# Patient Record
Sex: Male | Born: 1980 | Race: White | Hispanic: No | Marital: Single | Smoking: Former smoker
Health system: Southern US, Academic
[De-identification: ages and names within clinical notes are randomized; demographics above are authoritative.]

## PROBLEM LIST (undated history)

## (undated) DIAGNOSIS — C801 Malignant (primary) neoplasm, unspecified: Secondary | ICD-10-CM

## (undated) DIAGNOSIS — I10 Essential (primary) hypertension: Secondary | ICD-10-CM

## (undated) DIAGNOSIS — R12 Heartburn: Secondary | ICD-10-CM

## (undated) DIAGNOSIS — C4359 Malignant melanoma of other part of trunk: Secondary | ICD-10-CM

## (undated) DIAGNOSIS — Z973 Presence of spectacles and contact lenses: Secondary | ICD-10-CM

## (undated) DIAGNOSIS — Z87898 Personal history of other specified conditions: Secondary | ICD-10-CM

## (undated) DIAGNOSIS — G1221 Amyotrophic lateral sclerosis: Secondary | ICD-10-CM

## (undated) DIAGNOSIS — E785 Hyperlipidemia, unspecified: Secondary | ICD-10-CM

## (undated) HISTORY — DX: Malignant melanoma of other part of trunk (CMS HCC): C43.59

## (undated) HISTORY — DX: Essential (primary) hypertension: I10

## (undated) HISTORY — PX: ORBITAL RECONSTRUCTION: SHX2115

## (undated) HISTORY — PX: EXCISION BENIGN SKIN LESION TRUNK / ARM / LEG: SUR490

## (undated) HISTORY — PX: HX LAP CHOLECYSTECTOMY: SHX56

## (undated) HISTORY — PX: HX TONSILLECTOMY: SHX27

## (undated) HISTORY — PX: LUMBAR PUNCTURE: SHX1985

## (undated) HISTORY — PX: TONSILLECTOMY: SUR1361

## (undated) HISTORY — PX: CHOLECYSTECTOMY: SHX55

---
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
## 2013-11-06 ENCOUNTER — Ambulatory Visit: Payer: BC Managed Care – PPO | Attending: Dermatology | Admitting: Dermatology

## 2013-11-06 VITALS — BP 120/85 | Ht 68.98 in | Wt 273.6 lb

## 2013-11-06 DIAGNOSIS — Q828 Other specified congenital malformations of skin: Secondary | ICD-10-CM

## 2013-11-06 DIAGNOSIS — L814 Other melanin hyperpigmentation: Secondary | ICD-10-CM

## 2013-11-06 DIAGNOSIS — L819 Disorder of pigmentation, unspecified: Secondary | ICD-10-CM

## 2013-11-06 NOTE — Progress Notes (Signed)
Subjective:       Patient ID: Victor Frazier is a 33 y.o. male     Chief Complaint:     Chief Complaint   Patient presents with    Skin Check     Multiple Nevi, Skin tags bilateral axilla and neck.        HPI 33 yo male presenting for evaluation of a lesion on the nose and for lesions in the armpit.  Admits to developing a brown lesion on the nose.  States the lesion is sometimes scaly after her gets a lot of sun.  No rapid growth.  No pain or bleeding associated.  Denies a personal history of skin cancer.  Unknown family history due to adoption.  He also notes multiple tender lesions in the bilateral armpits.  No bleeding associated.  Lesions occasionally bleed.  He does not use sunscreen.  No other skin related complaints.      Review of Systems   Constitutional: Negative for fever and chills.   Skin: Negative for itching and rash.     Current Outpatient Prescriptions   Medication Sig    amoxicillin (AMOXIL) 500 mg Oral Capsule Take 500 mg by mouth Three times a day    metoprolol (LOPRESSOR) 25 mg Oral Tablet Take 25 mg by mouth Once a day       Objective:   .   Filed Vitals:    11/06/13 0955   BP: 120/85   Height: 1.752 m (5' 8.98")   Weight: 124.1 kg (273 lb 9.5 oz)       Physical Exam   Constitutional: He appears well-developed and well-nourished. No distress.   HENT:   Head:       Neurological: He is alert.   Skin: No rash noted. He is not diaphoretic. No erythema. No pallor.          General skin exam was performed including head, neck, anterio/posterior trunk, bilateral upper and lower extremitites and revealed no areas of concern other than those documented.     Assessment & Plan:       ICD-9-CM    1. Solar lentigo - cannot rule-out early pigmented actinic keratoses, although no evidence of scale today    -follow  -start sunscreen daily, at least on the face  -wear protective hat  -return to clinic for possible cryotherapy if lesion grows, changes, becomes scaly 709.09    2. Accessory skin tags (see  number 1 on body diagram) - irritated  -removed with tissue scissors x 8; bleeding controlled with aluminum chloride 757.39 EXCISION SKIN TAGS (AMB ONLY)     Ricki Miller, MD

## 2013-11-06 NOTE — Procedures (Signed)
See progress note.

## 2014-01-09 ENCOUNTER — Ambulatory Visit (INDEPENDENT_AMBULATORY_CARE_PROVIDER_SITE_OTHER): Payer: BC Managed Care – PPO | Admitting: Otolaryngology

## 2014-01-16 ENCOUNTER — Encounter (INDEPENDENT_AMBULATORY_CARE_PROVIDER_SITE_OTHER): Payer: Self-pay | Admitting: Otolaryngology

## 2014-01-16 ENCOUNTER — Other Ambulatory Visit (INDEPENDENT_AMBULATORY_CARE_PROVIDER_SITE_OTHER): Payer: BC Managed Care – PPO

## 2014-01-16 ENCOUNTER — Ambulatory Visit (INDEPENDENT_AMBULATORY_CARE_PROVIDER_SITE_OTHER): Payer: BC Managed Care – PPO | Admitting: Otolaryngology

## 2014-01-16 VITALS — BP 124/92 | HR 88 | Temp 98.0°F | Ht 69.0 in | Wt 272.9 lb

## 2014-01-16 DIAGNOSIS — J3489 Other specified disorders of nose and nasal sinuses: Secondary | ICD-10-CM

## 2014-01-16 DIAGNOSIS — R51 Headache: Secondary | ICD-10-CM

## 2014-01-16 DIAGNOSIS — J309 Allergic rhinitis, unspecified: Secondary | ICD-10-CM

## 2014-01-16 DIAGNOSIS — R519 Headache, unspecified: Secondary | ICD-10-CM

## 2014-01-16 DIAGNOSIS — J320 Chronic maxillary sinusitis: Secondary | ICD-10-CM

## 2014-01-16 MED ORDER — TRIAMCINOLONE ACETONIDE 55 MCG NASAL SPRAY AEROSOL
2.0000 | INHALATION_SPRAY | Freq: Every day | NASAL | Status: DC
Start: 2014-01-16 — End: 2014-01-17
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
## 2014-01-17 ENCOUNTER — Other Ambulatory Visit (INDEPENDENT_AMBULATORY_CARE_PROVIDER_SITE_OTHER): Payer: Self-pay | Admitting: Otolaryngology

## 2014-01-17 ENCOUNTER — Encounter (INDEPENDENT_AMBULATORY_CARE_PROVIDER_SITE_OTHER): Payer: Self-pay | Admitting: Otolaryngology

## 2014-01-17 DIAGNOSIS — J3489 Other specified disorders of nose and nasal sinuses: Secondary | ICD-10-CM

## 2014-01-17 DIAGNOSIS — J320 Chronic maxillary sinusitis: Secondary | ICD-10-CM

## 2014-01-17 DIAGNOSIS — R51 Headache: Secondary | ICD-10-CM

## 2014-01-17 DIAGNOSIS — J309 Allergic rhinitis, unspecified: Secondary | ICD-10-CM

## 2014-01-17 DIAGNOSIS — R519 Headache, unspecified: Secondary | ICD-10-CM

## 2014-01-17 MED ORDER — TRIAMCINOLONE ACETONIDE 55 MCG NASAL SPRAY AEROSOL
2.0000 | INHALATION_SPRAY | Freq: Every day | NASAL | Status: AC
Start: 2014-01-17 — End: 2014-02-16

## 2014-01-17 NOTE — Telephone Encounter (Signed)
-----   Message from Berlin Hun sent at 01/16/2014  4:48 PM EDT -----  >> Tula Nakayama PYERITZ 01/16/2014 04:48 PM  [place]  Pt states that the nasacort was supposed to be sent to the    [id number] [place], [place]    [address] [place] [address]    Phone: [phone] Fax: [phone]    Open 24 Hours?: Yes      Thanks

## 2014-01-17 NOTE — Telephone Encounter (Signed)
Medication was sent to the wrong pharmacy--the correct pharmacy is [facility] [address]

## 2014-04-22 ENCOUNTER — Other Ambulatory Visit (HOSPITAL_COMMUNITY): Payer: Self-pay | Admitting: Chiropractor

## 2014-04-22 ENCOUNTER — Ambulatory Visit
Admission: RE | Admit: 2014-04-22 | Discharge: 2014-04-22 | Disposition: A | Discharge status: Home / Self Care | Payer: BC Managed Care – PPO | Source: Ambulatory Visit | Attending: Chiropractor | Admitting: Chiropractor

## 2014-04-22 DIAGNOSIS — M545 Low back pain, unspecified: Secondary | ICD-10-CM

## 2014-04-22 DIAGNOSIS — M21161 Varus deformity, not elsewhere classified, right knee: Secondary | ICD-10-CM

## 2014-04-22 DIAGNOSIS — M2170 Unequal limb length (acquired), unspecified site: Secondary | ICD-10-CM | POA: Insufficient documentation

## 2014-04-22 DIAGNOSIS — M21162 Varus deformity, not elsewhere classified, left knee: Secondary | ICD-10-CM

## 2014-05-24 ENCOUNTER — Ambulatory Visit (INDEPENDENT_AMBULATORY_CARE_PROVIDER_SITE_OTHER): Payer: BC Managed Care – PPO

## 2014-05-24 DIAGNOSIS — Z3141 Encounter for fertility testing: Principal | ICD-10-CM

## 2014-05-24 NOTE — Progress Notes (Signed)
.  [facility]  Andrology Lab  [address]  [place], [place] [address]    Horald Chestnut, PhD, [facility]           Oralia Rud, PhD, HCLD  Henreitta Cea, BS, [place]   Phone: [phone]  FAX: [phone]   Semen Analysis    Pt Name Victor Frazier  Procedure: Semen Analysis     Wife's Name n/a   Date: 05/24/14   Physician [place] Crane  Days of Abstinence: 3   Time of Ejaculate 1147  Method (1) Cup   Time Sample Received 1147  Method (2) Masturbation   Time of Analysis 1209  Technologist DZ   Patient Lab number [id number]  Any Sample lost No     Characteristics of Semen Sample  Characteristic  Normal  Sperm Count  Normal   Volume in ml 2.0 1.5 to 55ml  Liquefaction yes Yes   Color Bonney Leitz to Marathon Oil  Gel no No   PH Value 8.2 7.5 to 8.5  Total Sperm (Mil/ml) 78.7 >15  million/ml   Round Cells 0 < 5/hpf  Active Sperm (Mil/ml) 44.3 > 10 million/ml   Viscosity 1 < 2  % Motile 56.4 >40%   Debris Low Low  Grade 3.0 3 to 4   Agglutination no No   Technologist DZ   Crystals no No         Sperm Morphology  (Based upon the [facility] 2011 Standard)    Normal    Normal   WHO Normal Forms 8.5% > 11%  Twin Tail 1% < 4%   Abnormal Head 76% < 70%  Cytoplasmic Droplet 0 < 4%   Tapered Head 1% < 4%  Other 6.5% < 4%   Coiled Tail 7% < 4%  Technologist DZ      Strict Sperm Morphology (Based upon the [facility] Strict Morphology Standard)  Normal Forms  4% Normal > 10%  Marginal 5-9%  Abnormal Forms 96% Normal < 90%  Marginal 91-95% Technologist  DZ     Comments  Liquifaction  WBC Stain    Morphology  Other      Andrologist's Comment:   Report Date: 05/27/2014

## 2014-06-10 ENCOUNTER — Other Ambulatory Visit (INDEPENDENT_AMBULATORY_CARE_PROVIDER_SITE_OTHER): Payer: BC Managed Care – PPO

## 2014-06-10 DIAGNOSIS — R51 Headache: Secondary | ICD-10-CM

## 2014-06-10 DIAGNOSIS — J3489 Other specified disorders of nose and nasal sinuses: Secondary | ICD-10-CM

## 2014-06-10 DIAGNOSIS — R519 Headache, unspecified: Secondary | ICD-10-CM

## 2014-06-10 DIAGNOSIS — J320 Chronic maxillary sinusitis: Principal | ICD-10-CM

## 2014-06-10 DIAGNOSIS — J309 Allergic rhinitis, unspecified: Secondary | ICD-10-CM

## 2014-06-11 ENCOUNTER — Ambulatory Visit (INDEPENDENT_AMBULATORY_CARE_PROVIDER_SITE_OTHER): Payer: BC Managed Care – PPO | Admitting: Otolaryngology

## 2014-06-11 ENCOUNTER — Encounter (INDEPENDENT_AMBULATORY_CARE_PROVIDER_SITE_OTHER): Payer: Self-pay | Admitting: Otolaryngology

## 2014-06-11 VITALS — BP 128/76 | HR 91 | Temp 97.6°F | Ht 69.02 in | Wt 279.4 lb

## 2014-06-11 DIAGNOSIS — J320 Chronic maxillary sinusitis: Secondary | ICD-10-CM

## 2014-06-11 DIAGNOSIS — J3489 Other specified disorders of nose and nasal sinuses: Principal | ICD-10-CM

## 2014-06-11 DIAGNOSIS — R51 Headache: Secondary | ICD-10-CM

## 2014-06-11 DIAGNOSIS — R519 Headache, unspecified: Secondary | ICD-10-CM
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
## 2014-07-22 ENCOUNTER — Encounter (INDEPENDENT_AMBULATORY_CARE_PROVIDER_SITE_OTHER): Payer: BC Managed Care – PPO | Admitting: Otolaryngology

## 2015-01-30 ENCOUNTER — Ambulatory Visit (INDEPENDENT_AMBULATORY_CARE_PROVIDER_SITE_OTHER): Payer: BC Managed Care – PPO | Admitting: Otolaryngology

## 2015-01-30 VITALS — BP 114/88 | HR 88 | Temp 97.5°F | Ht 69.0 in | Wt 281.4 lb

## 2015-01-30 DIAGNOSIS — J329 Chronic sinusitis, unspecified: Secondary | ICD-10-CM

## 2015-01-30 DIAGNOSIS — J320 Chronic maxillary sinusitis: Secondary | ICD-10-CM

## 2015-01-30 MED ORDER — AMOXICILLIN 875 MG-POTASSIUM CLAVULANATE 125 MG TABLET
1.0000 | ORAL_TABLET | Freq: Two times a day (BID) | ORAL | Status: AC
Start: 2015-01-30 — End: 2015-02-09

## 2015-01-30 NOTE — Progress Notes (Signed)
[facility], [place]  [place] [address]  [phone]    PATIENT NAME:  Victor Frazier  MRN:  [id number]  DOB:  21-Jan-1981  DATE OF SERVICE: 01/30/2015    HPI:  Ori Trejos is a 34 y.o. year old male who comes in with headache and evidence of sinusitis.  He was last seen in December 2015 for follow up.  He had a history of recurrent sinusitis and an injury to the left side of his face.  He had partial opacification of the left maxillary sinus, which I felt was related primarily to his injury and subsequent facial corrective surgery.  Repeat films done revealed some degree of haziness in the maxillary antrum inferiorly but there was improvement.  Recently he started getting in trouble with sinusitis again.  He was traveling in his works 3 weeks ago and developed bilateral maxillary discomfort.  He was put on Zithromax, which did not help.  Two weeks ago he was put on Doxycycline, which he did not tolerate well.  He stopped that about 10 days ago when he was at the ER in [place], where he was given Toradol, Ultracet for pain and Augmentin 875 mg x14 days.  He has about a week of the Augmentin left.  He is feeling a lot better but not completely at this point.  He is on no other medications.  In the last several years he has started having more allergy symptoms, which might be the root of his recurrent sinusitis problem.     Physical Exam:  Blood pressure 114/88, pulse 88, temperature 36.4 C (97.5 F), temperature source Thermal Scan, height 1.753 m (5\' 9" ), weight 127.642 kg (281 lb 6.4 oz), SpO2 98 %.  Body mass index is 41.54 kg/(m^2).  On exam he is a well nourished, well developed, healthy appearing 34 year old in no acute distress.  Head is normocephalic.  Facies are symmetric.  External ears, canals and tympanic membranes are normal.  Nose reveals mild erythema.  Mouth and oropharynx are normal.  Nasopharynx reveals some purulent drainage on the  left.      Procedure:  No notes on file    Data Reviewed:  I reviewed his CT scan, which showed some chronic changes in the floor of the left maxillary sinus, which I think is mostly from his previous problem.     Assessment:    (1) Recurrent sinusitis, currently being treated with Augmentin.    Plan:    (1) I will have him finish his current Augmentin course and add another 10 days to make sure we have knocked it out.  I will have him get on Nasacort AQ and use a Neti Pot, which he recently got but has not used much.  I want him to consider getting allergy testing performed, which he is interested in doing.     Corky Sing., MD    SDR  07.29.16

## 2015-02-17 ENCOUNTER — Ambulatory Visit (HOSPITAL_BASED_OUTPATIENT_CLINIC_OR_DEPARTMENT_OTHER): Payer: BC Managed Care – PPO

## 2015-02-17 ENCOUNTER — Ambulatory Visit
Payer: BC Managed Care – PPO | Attending: Dermatology | Admitting: Student in an Organized Health Care Education/Training Program

## 2015-02-17 ENCOUNTER — Other Ambulatory Visit (HOSPITAL_BASED_OUTPATIENT_CLINIC_OR_DEPARTMENT_OTHER): Payer: Self-pay | Admitting: Dermatology

## 2015-02-17 VITALS — BP 124/88 | Ht 68.54 in | Wt 276.5 lb

## 2015-02-17 DIAGNOSIS — L918 Other hypertrophic disorders of the skin: Secondary | ICD-10-CM | POA: Insufficient documentation

## 2015-02-17 DIAGNOSIS — L989 Disorder of the skin and subcutaneous tissue, unspecified: Secondary | ICD-10-CM

## 2015-02-17 DIAGNOSIS — M791 Myalgia, unspecified site: Secondary | ICD-10-CM

## 2015-02-17 DIAGNOSIS — L720 Epidermal cyst: Secondary | ICD-10-CM | POA: Insufficient documentation

## 2015-02-17 LAB — CREATINE KINASE (CK), TOTAL, SERUM OR PLASMA: CREATINE KINASE: 696 U/L — ABNORMAL HIGH (ref 45–225)

## 2015-02-17 NOTE — Progress Notes (Addendum)
Skin biopsy x 1. John Kozikowski, LPN

## 2015-02-17 NOTE — Progress Notes (Signed)
02/17/15 1100   Medication Administration   Medication  Lidocaine with Epi   Medication Dose 1cc   Route of Administration Intradermal   NDC # C107165   LOT # [id number]   Expiration date 10/04/14   Manufacturer APP   Clinic Supplied Yes   Patient Supplied No   Comments: Skin biopsy x 1.

## 2015-02-17 NOTE — Procedures (Addendum)
Please see progress note.     Victor Newbold, MD

## 2015-02-17 NOTE — Progress Notes (Addendum)
Subjective:       Patient ID: Victor Frazier is a 34 y.o. male     Chief Complaint:     Chief Complaint   Patient presents with    Skin Check     Skin/mole check. Last seen 11-17-13. Hx of solar lentigo, skin tags.        HPI   34 year old white male with no history of skin cancer and unknown family history of skin cancer presents for skin check. He complains of irritated skin tags and a cyst on the right flank. He notes that recently he was seen by Victor Frazier rheumatology for muscle pain of his lower calf. Victor Frazier at that time did muscle enzymes which were elevated per patient. He has no weakness or muscle pain today. He recently lost 20 pounds in thee weeks from being sick with a terrible sinus infection.     Review of Systems   Constitutional: Negative for fever.   Skin: Negative for rash.     Current Outpatient Prescriptions   Medication Sig    amoxicillin (AMOXIL) 500 mg Oral Capsule Take 500 mg by mouth Three times a day    DM HB/PSEUDOEPHED/ACETAMIN/CP (THERAFLU COLD-COUGH ORAL) Take by mouth    GUAIFENESIN/DEXTROMETHORPHAN (MUCINEX DM ORAL) Take by mouth    metoprolol (LOPRESSOR) 25 mg Oral Tablet Take 25 mg by mouth Once a day       Objective:   .   Filed Vitals:    02/17/15 0853   BP: 124/88   Height: 1.741 m (5' 8.54")   Weight: 125.4 kg (276 lb 7.3 oz)       Physical Exam   Constitutional: No distress.   Musculoskeletal:        Left lower leg: He exhibits no tenderness, no swelling and no edema.   Skin:          General skin exam was performed including head, neck, anterior and posterior trunk, bilateral upper, and lower extremities and revealed no areas of concern other than those documented.        Assessment & Plan:     Inflamed acrochordon body (body #3)  - Procedure: Area cleaned with alcohol and skin tags removed x2 (right neck and right axilla) with tissue scissors. Bleeding controlled with Aluminum Chloride.  Vaseline and Band-Aid applied. Patient tolerated well.    Skin lesion: IDN  vs cyst (body #4)  - TIME OUT: A time out was performed to confirm the correct patient, procedure, and site. Consent obtained, area cleaned, and anesthetized. A shave biopsy was performed of right flank. Aluminum chloride was used for hemostasis. Vaseline and bandage were placed over the wound and wound care instructions were given. Patient demonstrated understanding of the instructions. Patient was advised that it would take approximately 2-3 weeks for the pathology results to be available and that I will personally contact the patient at the number provided to further discuss the pathology results.    Hx of muscle tenderness and elevated CPK  Recheck CPK and aldolase  Follow up in 6 months    Victor Stabile, MD  02/17/2015, 11:23        I saw and examined the patient.  I was present and supervised the entire procedure.  I reviewed the resident's note.  I agree with the findings and plan of care as documented in the resident's note.  Any exceptions/additions are edited/noted.    Dairl Ponder, MD 02/23/2015, 21:40

## 2015-02-18 LAB — ALDOLASE, SERUM: ALDOLASE, SERUM: 8.2 U/L — ABNORMAL HIGH (ref <7.7)

## 2015-02-19 LAB — HISTORICAL SURGICAL PATHOLOGY SPECIMEN

## 2015-06-03 ENCOUNTER — Ambulatory Visit
Payer: BC Managed Care – PPO | Attending: Dermatology | Admitting: Student in an Organized Health Care Education/Training Program

## 2015-06-03 VITALS — BP 154/90 | HR 87 | Ht 68.35 in | Wt 286.4 lb

## 2015-06-03 DIAGNOSIS — M3320 Polymyositis, organ involvement unspecified: Secondary | ICD-10-CM | POA: Insufficient documentation

## 2015-06-03 DIAGNOSIS — M721 Knuckle pads: Secondary | ICD-10-CM | POA: Insufficient documentation

## 2015-06-03 NOTE — Progress Notes (Addendum)
Subjective:       Patient ID: Victor Frazier is a 34 y.o. male     Chief Complaint:     Chief Complaint   Patient presents with    Skin Check        HPI   34 y.o. white male with no history of skin cancer and unknown family history of skin cancer presents for follow up for muscle weakness. 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 It is no longer painful, but he does have some weakness. His Rheumatologist did labs last week. Not treating as is often self limiting. CPK and Aldolase have been elevated in the past. He bites his nails. No rash     Review of Systems   Constitutional: Negative for fever and chills.   Skin: Negative for rash and wound.     Current Outpatient Prescriptions   Medication Sig    amoxicillin (AMOXIL) 500 mg Oral Capsule Take 500 mg by mouth Three times a day    DM HB/PSEUDOEPHED/ACETAMIN/CP (THERAFLU COLD-COUGH ORAL) Take by mouth    GUAIFENESIN/DEXTROMETHORPHAN (MUCINEX DM ORAL) Take by mouth    metoprolol (LOPRESSOR) 25 mg Oral Tablet Take 25 mg by mouth Once a day       Objective:   .   Filed Vitals:    06/03/15 0938   BP: 154/90   Pulse: 87   Height: 1.736 m (5' 8.35")   Weight: 129.9 kg (286 lb 6 oz)       Physical Exam   Constitutional: He appears well-developed and well-nourished. No distress.   Musculoskeletal:        Left lower leg: He exhibits no tenderness, no swelling and no edema.   Skin: Skin is warm and dry.          General skin exam was performed including head, neck, anterior and posterior trunk, bilateral upper, and lower extremities and revealed no areas of concern other than those documented.        Assessment & Plan:     Favor Knuckle pads (#2 body)  - Reassurance    Changes secondary to nail biting (#1 body)  - Reassurance.    Localized polymyositis   - Following with rheumatology.  - No rash today or ever.  - Will call if develops any skin symptoms.    The patient was educated on the importance of avoiding excessive sun exposure and wearing  sunscreen daily.  Advised patient to re-apply sunscreen every 2-3 hours.  Advised the patient to avoid going to the tanning beds.  Advised to check skin routinely for any changes, especially any new moles or changes in existing moles.    RTC PRN    Dolores Patty, MD 06/03/2015 10:05  [facility]  [facility]        I saw and examined the patient.  I reviewed the resident's note.  I agree with the findings and plan of care as documented in the resident's note.  Any exceptions/additions are edited/noted.    Dairl Ponder, MD

## 2015-08-20 ENCOUNTER — Encounter (HOSPITAL_BASED_OUTPATIENT_CLINIC_OR_DEPARTMENT_OTHER): Payer: BC Managed Care – PPO | Admitting: Student in an Organized Health Care Education/Training Program

## 2016-03-19 ENCOUNTER — Encounter (HOSPITAL_BASED_OUTPATIENT_CLINIC_OR_DEPARTMENT_OTHER): Payer: Self-pay

## 2016-03-19 NOTE — Progress Notes (Signed)
New patient packet received.    Victor Parody Ashwin Tibbs, MA  03/19/2016, 15:30

## 2016-05-31 ENCOUNTER — Ambulatory Visit (HOSPITAL_BASED_OUTPATIENT_CLINIC_OR_DEPARTMENT_OTHER): Payer: BC Managed Care – PPO | Admitting: Rheumatology

## 2016-06-01 ENCOUNTER — Ambulatory Visit (HOSPITAL_BASED_OUTPATIENT_CLINIC_OR_DEPARTMENT_OTHER): Payer: BC Managed Care – PPO

## 2016-06-01 ENCOUNTER — Ambulatory Visit: Payer: BC Managed Care – PPO | Attending: Rheumatology | Admitting: Rheumatology

## 2016-06-01 VITALS — BP 136/80 | HR 83 | Temp 97.3°F | Ht 68.35 in | Wt 269.4 lb

## 2016-06-01 DIAGNOSIS — M3320 Polymyositis, organ involvement unspecified: Secondary | ICD-10-CM | POA: Insufficient documentation

## 2016-06-01 DIAGNOSIS — R531 Weakness: Secondary | ICD-10-CM | POA: Insufficient documentation

## 2016-06-01 DIAGNOSIS — Z6841 Body Mass Index (BMI) 40.0 and over, adult: Secondary | ICD-10-CM

## 2016-06-01 DIAGNOSIS — M545 Low back pain: Secondary | ICD-10-CM | POA: Insufficient documentation

## 2016-06-01 DIAGNOSIS — R29898 Other symptoms and signs involving the musculoskeletal system: Secondary | ICD-10-CM

## 2016-06-01 DIAGNOSIS — Z720 Tobacco use: Secondary | ICD-10-CM | POA: Insufficient documentation

## 2016-06-01 LAB — COMPREHENSIVE METABOLIC PANEL, NON-FASTING
ALBUMIN: 3.7 g/dL (ref 3.5–5.0)
ALBUMIN: 3.7 g/dL (ref 3.5–5.0)
ALKALINE PHOSPHATASE: 95 U/L (ref <150)
ALT (SGPT): 50 U/L (ref <55)
ANION GAP: 9 mmol/L (ref 4–13)
AST (SGOT): 39 U/L (ref 8–48)
BILIRUBIN TOTAL: 0.5 mg/dL (ref 0.3–1.3)
BUN/CREA RATIO: 19 (ref 6–22)
BUN: 14 mg/dL (ref 8–25)
CALCIUM: 9.3 mg/dL (ref 8.5–10.2)
CHLORIDE: 106 mmol/L (ref 96–111)
CO2 TOTAL: 25 mmol/L (ref 22–32)
CREATININE: 0.72 mg/dL (ref 0.62–1.27)
ESTIMATED GFR: >59 mL/min/1.73mˆ2 (ref >59)
GLUCOSE: 89 mg/dL (ref 65–139)
GLUCOSE: 89 mg/dL (ref 65–139)
POTASSIUM: 4.2 mmol/L (ref 3.5–5.1)
PROTEIN TOTAL: 7.6 g/dL (ref 6.4–8.3)
SODIUM: 140 mmol/L (ref 136–145)

## 2016-06-01 LAB — CBC WITH DIFF
BASOPHIL #: 0.05 10*3/uL (ref 0.00–0.20)
BASOPHIL %: 1 %
EOSINOPHIL #: 0.15 10*3/uL (ref 0.00–0.50)
EOSINOPHIL %: 3 %
HCT: 43.8 % (ref 36.7–47.0)
HGB: 15.4 g/dL (ref 12.5–16.3)
LYMPHOCYTE #: 1.25 10*3/uL (ref 1.00–4.80)
LYMPHOCYTE %: 24 %
MCH: 31.2 pg (ref 27.4–33.0)
MCHC: 35.1 g/dL (ref 32.5–35.8)
MCHC: 35.1 g/dL (ref 32.5–35.8)
MCV: 88.9 fL (ref 78.0–100.0)
MONOCYTE #: 0.64 10*3/uL (ref 0.30–1.00)
MONOCYTE %: 12 %
MONOCYTE %: 12 %
MPV: 9.1 fL (ref 7.5–11.5)
MPV: 9.1 fL (ref 7.5–11.5)
NEUTROPHIL #: 3.12 10*3/uL (ref 1.50–7.70)
NEUTROPHIL %: 60 %
PLATELETS: 217 10*3/uL (ref 140–450)
RBC: 4.93 10*6/uL (ref 4.06–5.63)
RBC: 4.93 10*6/uL (ref 4.06–5.63)
RDW: 13.4 % (ref 12.0–15.0)
WBC: 5.2 10*3/uL (ref 3.5–11.0)

## 2016-06-01 LAB — CREATINE KINASE (CK), TOTAL, SERUM OR PLASMA: CREATINE KINASE: 775 U/L — ABNORMAL HIGH (ref 45–225)

## 2016-06-01 NOTE — H&P (Signed)
Requesting Physician: Maud Deed, CFNP  History of Present Illness  Victor Frazier is a 35 y.o. male who presents with a chief complaint of No chief complaint on file.   to clinic. Was a patient of Dr. 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 Last saw him May 2017. About 3 years ago. Pain in left calf and didn't think anything of it. When he walked a lot he had problems. Went to therapy and thought it was knots. Went to orthopedics and was sent to Dr. Marinell Blight and had blood work and MRI. Showed swelling/inflammation in the muscles and blood work showed elevated Ck - 500? Double - it was in high 300s. Localized left leg. Reportedly he talked to muscle doctors at [facility] and was told that it would work itself out. Never had EMG and no treatment. Was just being monitored and CK was trending down.   He doesn't have the muscle pains.   He has trouble walking up stairs but you can run on the elliptical 3-4 miles and no problems  Left leg is week.   Used to have big calf muscles and he cant even flex it.   Late 2014 when it all started  Under the weather today    Past History  Current Outpatient Prescriptions   Medication Sig    GUAIFENESIN/DEXTROMETHORPHAN (MUCINEX DM ORAL) Take by mouth    metoprolol (LOPRESSOR) 25 mg Oral Tablet Take 25 mg by mouth Once a day     No Known Allergies  Past Medical History:   Diagnosis Date    HTN (hypertension)          Past Surgical History:   Procedure Laterality Date    HX TONSILLECTOMY           Family History  Family Medical History     Problem Relation (Age of Onset)    No Known Problems Other            Social History  Social History     Social History    Marital status: Single     Spouse name: N/A    Number of children: N/A    Years of education: N/A     Social History Main Topics    Smoking status: Current Every Day Smoker     Start date: 08/20/2011    Smokeless tobacco: Former Systems developer    Alcohol use No    Drug use: Not on file    Sexual activity: Not on file         Other Topics Concern    Not on file     Social History Narrative     Review of Systems  Review of Systems:  Erskine Speed is positive. Rest are negative    Constitutional: fevers, drastic weight changes - he had lost 50 pounds but intentional, excessive fatigue, decreased appetite, jaw claudication (pain in jaw while chewing), scalp tenderness, poor/unrefreshed sleep  Eyes: Dryness (feels like something in the eye), hx of uveitis (recurrent red eye not infection or allergies), new vision problems (blurred or double vision)  macular degeneration  Ears, nose, mouth, throat, and face: nosebleeds, sores in mouth, mouth dryness, sores in nose, hair loss, hearing loss,  - orbital broken in 3 places - was told he would have sinus issues because of that - sinusitis/sinus issues, unexplained crumbling teeth/tooth decay  Respiratory: shortness of breath, cough, hemoptysis, hx of pleuritis, hx of pleural effusion, hx of COPD, hx of asthma - hx of smoking -  non currently  Cardiovascular: unexplained chest pain, hx of pericardial effusion, hx of cardiac disease - stents/heart failure, hx of pericarditis, arrythmias  Gastrointestinal: difficulty swallowing/choking sensation, heart burn, diarrhea, blood in stool, constipation, Irritable bowel syndrome, inflammatory bowel syndrome   Genitourinary: unexplained blood in urine, hx of renal failure,  kidney stones  Integument: photosensitivity, hx of erythema nodosum, rash, hx of psoriasis  Hematologic/lymphatic: hx of blood clots, currently on blood thinners, hx of malignancy, currently on chemotherapy or radiation, hx of radiation, hx of chemotherapy, hx of cytopenias (not related to menses or pregnancy)  Musculoskeletal: he gets knee pains - catcher and wrestling - has bad shoulder - shoulder has hut 8 years - has had 7 MRIs - never showed anything - he has a snapping sensation - happened when he was 17 - he has multiple MRIs - was offered exploratory surgery but he declined.    Neurological: tingling/numbness, hx of strokes, foot or wrist drop, seizures, weakness, headaches - (migraines)  Behavioral/Psych: depression, anxiety, other psychiatric disorder  Endocrine: hx of diabetes, hx of thyroid disease               Other: raynaud's, recurrent infections    Examination  Vitals: BP 136/80   Pulse 83   Temp 36.3 C (97.3 F) (Thermal Scan)    Ht 1.736 m (5' 8.35")   Wt 122.2 kg (269 lb 6.4 oz)   SpO2 98%   BMI 40.54 kg/m2  General: appears in good health, no acute distress  Eyes: Conjunctiva clear. Pupils equal and round.   HENT: No External ear tophi., Pinna without redness or swelling - Oral pooling is good. No sinus tenderness, no oral ulcers   Cardiovascular: Normal. Heart sounds are not distant  Lungs: clear to auscultation bilaterally. No wheezes, no crackles  Abdomen: soft, non-tender and bowel sounds normal   Extremities: no peripheral edema - good pulses  Musculoskeletal:   Joints: Full ROM and no synovitis or pain noted on ROM of the upper or lower extremities except as noted - examined joints ---> hands/wrist/shoulders/elbows/ankles/feet/knees/hips - looked for nodules and deformities and ROM assessed. Tinel's negative.   Neck: Full ROM not painful - tenderness s  Skin: No rashes or lesions (looked for malar rash, sclerodactyly, heliotrope, psoriasis, sclerodactyly, digital pits, digital ulcers, gottrons papules, nail pitting, periungal erythema, petechia, purpura and tophi, shawl sign)   Neuromusculoskeletal: Up to exam table without assistance. Proximal muscle strength grossly intact.   Psychiatric: affect normal - answers questions appropriately   Labs/X-rays reviewed:  No visits with results within 1 Year(s) from this visit.  Latest known visit with results is:    Appointment on 02/17/2015   Component Date Value Ref Range Status    ALDOLASE, SERUM 02/17/2015 8.2* <7.7 U/L Final       Test Performed by:  [facility]  [place],  [place] [address]  Laboratory Director: Curt Jews, II, M.D., Ph.D.    CREATINE KINASE 02/17/2015 696* 45 - 225 U/L Final     Patient Name: ARNET, MICHAELSON. Accession #: [id number] Med. Rec. #: [id number]  Client: Rojelio Brenner Taken: 02/17/2015 DOB: 1981/06/21 (Age: 34) Location: [facility]  Received: 02/18/2015 Gender:  M Service:  Reported: 02/19/2015   Billing #:  [id number]   Physician(s):  Elmo Putt, M.D.  Mannie Stabile, M.D.  Copy To:      Specimen(s) Received       A: RIGHT FLANK SHAVE SKIN  Final Pathologic Diagnosis      A.  SKIN, RIGHT FLANK, SHAVE BIOPSY:       -  Epidermal inclusion cyst, superficial biopsy  XR LEG ALIGNMENT BILATERAL performed for Burke Keels on Apr 22, 2014 11:06 AM.     CLINICAL HISTORY: 35 y.o. male with Low back pain     4 views were obtained and submitted for review.       FINDINGS: The right tibiofemoral length measures approximately 87.7 cm and the left tibiofemoral length measures approximately 88.4 cm.     Minimal genu varus deformities are detected bilaterally.     The bilateral hip, knee and ankle joint spaces are preserved.     IMPRESSION:  1. Slight leg length discrepancy, with a shorter right-sided tibiofemoral length by approximately 7 mm.  2.  Minimal genu varus deformities bilaterally.    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 Weakness of lower extremity, unspecified laterality    Patient was a prior patient of Dr. Sherrye Payor.  Was told that he had limited myositis limited to the left leg.  I did a rough measurement of his calfs and equal on both sides.  He said that he has also been told that is equal in both sides by physical therapy.  HE does however feel that he is weak on the left leg.  I was unable to appreciate this weakness on exam today.  He is muscular gentleman.    No rashes or other concerns.  NO dysphagia or shortness of  breath.  He continues to work.  Our plan is as follows  1. We will get baseline CBC CMP aldolase and CK level.  If elevated I will refer him to neurology.  2. I have talked about possibly getting left lower extremity EMG.  It is challenging because of we have to do a biopsy from the same side that EMG will be done.  Other option is to do an MRI of the area and do a targeted biopsy.  3. Request for records from Dr. Sherrye Payor.  Patient has signed for this today.    Patient will see me back in 3 months.  Orders Placed This Encounter    CBC/DIFF    Comprehensive Metabolic Panel, Nf    ALDOLASE, SERUM    Cpk (Ck)    Ana       Patient was advised to contact the office within two weeks of any testing (e.g. Labs, xrays or any other testing)  if they have not been contacted by our office with the results and recommendations and follow up plan.   Patient was counseled that if follow up is planned to discuss results and work up and they are unable to keep their appointment, they must reschedule or contact our office.     Wandra Scot, MD    This note was partially generated using MModal Fluency Direct system, and there may be some incorrect words, spellings, and punctuation that were not noted in checking the note before saving.

## 2016-06-02 ENCOUNTER — Telehealth (HOSPITAL_BASED_OUTPATIENT_CLINIC_OR_DEPARTMENT_OTHER): Payer: Self-pay | Admitting: Rheumatology

## 2016-06-02 DIAGNOSIS — G729 Myopathy, unspecified: Secondary | ICD-10-CM

## 2016-06-02 LAB — ALDOLASE, SERUM: ALDOLASE, SERUM: 12 U/L — ABNORMAL HIGH (ref <7.7)

## 2016-06-02 NOTE — Progress Notes (Signed)
Dear Mr. Taras,       As discussed I will be referring you to a neuromuscular specialist here.  He will be contacting you with an appointment.    No change in plan otherwise.   Please let me know if you have any other questions or concerns.   Follow up as discussed in clinic.     Thank you for allowing [place] to participate in your care.     Sincerely,   Mercie Eon MD  Rheumatology

## 2016-06-02 NOTE — Telephone Encounter (Signed)
Please call patient.  Let him know that I am referring him to the musculoskeletal physicians here.  His muscle enzymes still appear to be elevated.  Before doing the nerve study I believe that he should see them since he only has this isolated weakness in 1 leg.

## 2016-06-03 LAB — ANA (ANTINUCLEAR ANTIBODIES), SERUM
ANTI-NUCLEAR ANTIBODIES,QUALITATIVE: NEGATIVE
ANTI-NUCLEAR ANTIBODIES,QUANTITATIVE: 0.25 {index_val} (ref <=0.90)

## 2016-06-03 NOTE — Progress Notes (Signed)
Dear Mr. Rychlik,     Here is a copy of your results.  As discussed we will be scheduling you with neurology.     No change in plan otherwise.   Please let me know if you have any other questions or concerns.   Follow up as discussed in clinic.     Thank you for allowing [place] to participate in your care.     Sincerely,   Mercie Eon MD  Rheumatology

## 2016-06-03 NOTE — Telephone Encounter (Signed)
Pt advised and comfortable with plan. No questions at this time. Russella Dar, RN  06/03/2016, 10:23

## 2016-06-27 ENCOUNTER — Other Ambulatory Visit: Payer: Self-pay

## 2016-07-19 ENCOUNTER — Encounter (INDEPENDENT_AMBULATORY_CARE_PROVIDER_SITE_OTHER): Payer: Self-pay | Admitting: Neurology

## 2016-07-19 ENCOUNTER — Ambulatory Visit: Payer: BC Managed Care – PPO | Attending: Neurology | Admitting: Neurology

## 2016-07-19 VITALS — BP 122/80 | HR 100 | Ht 69.0 in | Wt 279.5 lb

## 2016-07-19 DIAGNOSIS — I10 Essential (primary) hypertension: Secondary | ICD-10-CM | POA: Insufficient documentation

## 2016-07-19 DIAGNOSIS — M609 Myositis, unspecified: Principal | ICD-10-CM | POA: Insufficient documentation

## 2016-07-19 DIAGNOSIS — Z79899 Other long term (current) drug therapy: Secondary | ICD-10-CM | POA: Insufficient documentation

## 2016-07-19 DIAGNOSIS — Z87891 Personal history of nicotine dependence: Secondary | ICD-10-CM | POA: Insufficient documentation

## 2016-07-19 DIAGNOSIS — R29898 Other symptoms and signs involving the musculoskeletal system: Secondary | ICD-10-CM

## 2016-07-19 DIAGNOSIS — Z6841 Body Mass Index (BMI) 40.0 and over, adult: Secondary | ICD-10-CM

## 2016-07-19 NOTE — H&P (Signed)
[facility] Department of Neurology      Operated by [facility]  Outpatient History and Physical  Date:  07/19/2016  Name: Victor Frazier  MRN: [id number]  Age:  36 y.o.  Referring Physician:Shah, Harlon Ditty, MD  [address] [place] [place] [id number]  [place],  [place] [id number]    Consult: Yes    PCP:  Jason Fila, MD    CC:  Polymyositis      History Obtained from:  Patient and Family    HPI: 36 year old right hander PMH of HTN, orbital fracture due trauma presents presents for left leg weakness. Symptoms started approximately in 2015; with intermittent calf tenderness "felt like a knot". As time went by, the tenderness became constant. Around mid 2016 went to see Ortho, as he was he developed some weakness over the left calf muscle. MRI of the calf was done, reported as "swelling in the muscle" referred to PT and Rheumatologist Dr. Sherrye Payor. Received therapy for 4 months with some relief. Dr Sherrye Payor was monitoring.   Currently c/o difficulty in going up stair cases. Does not affect his walking, able to be on the Elliptical for 3-4 miles. Denies any falls, however c/o "rolling his ankle on the left side on few occasions"  Does not affect ADL's. Denies any trauma to the left calf.   C/o intermittent muscle twitches over b/l legs and biceps, frequency of about once a month.   Denies any other weakness, no dysphagia, no rashes, dry mouth. C/o left shoulder and knee pain due to sport injuries. Intermittent dry eyes.  Patient is adopted, normal development, able to keep up with peers, growing up he was not the last person in running among peers. Unable to provide any FH.    Patient works at [employer] and [employer]ol[employer] (November 2017)    Past Medical History:    Past Medical History:   Diagnosis Date   . HTN (hypertension)            Medications:   Outpatient Prescriptions Marked as Taking for the 07/19/16 encounter (Office Visit) with Casandra Doffing, MD   Medication Sig   . metoprolol  (LOPRESSOR) 25 mg Oral Tablet Take 25 mg by mouth Once a day       Allergies: No Known Allergies    Family History:  Adopted   Family Medical History     Problem Relation (Age of Onset)    No Known Problems Other            Surgical History:   Past Surgical History:   Procedure Laterality Date   . HX TONSILLECTOMY             Social History:    Social History     Social History   . Marital status: Single     Spouse name: N/A   . Number of children: N/A   . Years of education: N/A     Social History Main Topics   . Smoking status: Former Smoker     Start date: 08/20/2011   . Smokeless tobacco: Former Systems developer   . Alcohol use No   . Drug use: Not on file   . Sexual activity: Not on file     Other Topics Concern   . Not on file     Social History Narrative       Review of Systems  Constitutional-No fever  Eyes- No  visual change  ENT- Hearing normal  CV- No chest pain  Resp- No Shortness of breath  GI- No diarrhea  GU- Bladder normal  MS- No Arthritis  Skin- No rash  Psych- No depression  Endo- No DM  Heme- No nodes    PHYSICAL EXAM:    BP 122/80  Pulse 100  Ht 1.753 m (5\' 9" )  Wt 126.8 kg (279 lb 8.7 oz)  BMI 41.28 kg/m2    Appearance:No Acute Distress  Ophthalmoscopic: Disc Flat, Normal fundus  Carotid/Heart/Peripheral Vascular: No Bruits, RRR  Orientation: Awake, Alert, and Oriented x 3  Mental status:  Memory: Registation 3/3 Recall 3/3  Attention: Normal  Knowledge: Appropriate  Language: No aphasia  Speech: No dysarthria  Cranial Nerves:  2 No Visual Defect on Confrontation; Pupils round, equal, reactive tolight  3,4,6 Extraocular Movements Intact; no nystagmus  5 Facial Sensation Intact  7 No facial asymmetry  8 Intact hearing  9,10 Palate symmetric, normal gag  11 Good shoulder shrug  12 Tongue Midline  Gait: Stable, No ataxia, can perform tandem walking  Coordination: No ataxia with finger to nose testing and heel to shin testing  Sensory: Intact, Symmetric to Pinprick, Light Touch,Vibration, and Joint Position   Muscle Tone: Normal  Possible fasciculation over the RLE proximal lateral aspect on tensing the muscle, which disappears on relaxation. No tongue fasciculation. Left calf appears mildly smaller than the right      Muscle exam  Arm Right Left Leg Right Left   Deltoid 5/5 5/5 Iliopsoas 5/5 4/5   Biceps 5/5 5/5 Quads 5/5 -5/5   Triceps 5/5 5/5 Hamstrings 5/5 4/5   Wrist Extension 5/5 5/5 Ankle Dorsi Flexion 5/5 5/5   Wrist Flexion 5/5 5/5 Ankle Plantar Flexion 5/5 +4/5   Interossei 5/5 5/5 Ankle Eversion 5/5 5/5   APB 5/5 5/5 Ankle Inversion 5/5 5/5       Reflexes   RJ BJ TJ KJ AJ Plantars Hoffman's   Right 2+ 2+ 2+ 3+ 2+ Downgoing Not present   Left 2+ 2+ 2+ 3+ 2+ Downgoing Not present     Personal review of  Diabetes Monitors  A1C - Glucose - Lipids Microalbumin   No results for input(s): HA1C, GLUCOSEFAST, CHOLESTEROL, HDLCHOL, LDLCHOL, LDLCHOLDIR, TRIG in the last 13140 hours. No results for input(s): MICALBRNUR, MICALBCRERAT in the last 13140 hours.     Diabetic foot exam:     No edema bilaterally.                     Outside records:pending     Assessment/Plan:     ICD-10-CM    1. Myositis M60.9 NCS/EMG   2. Left leg weakness R29.898 Referral to Neurology     NCS/EMG     Orders Placed This Encounter   . NCS/EMG     36 year old right hander PMH of HTN, orbital fracture due trauma presents presents for left leg weakness.    Left Lower Extremity Myositis   - Unclear etiology at this time. Presentation is not consistent with polymyositis given the focal nature. On exam patient does have focal weakness over the LLE. HyperCKemia is possibly secondary to increase in muscle mass (muscular) and regular exercise.    - Will Obtain MRI of the spine and Left leg results from [facility]. Release of info sheet signed by patient   - Ordering EMG/NCS; concerns for Myositis   - Will consider a MRI LLE (left calf) and LLE muscle  biopsy following the EMG/NCS  - Will follow up in 2 months with the above test results      Galen Manila, MD 07/19/2016, 09:22      I saw and examined the patient.  I reviewed the resident's note.  I agree with the findings and plan of care as documented in the resident's note.  Any exceptions/additions are edited/noted.  Patient with 2-3 yr h/o left lower extremity tenderness that started in calf, has had minimal progression since. CK 600-700 range, does have mild weakness in the whole left lower extremity.   symptoms certainly not concering for generalized inflammatory muscle disease such as polymyositis. Since there has been some progression, recommend EMG/NCS to rule out a generalized muscle disease, to rule out any superimposed neurogenic radicular process which may explain the asymmetry. [facility] for [facility] left leg as well as MRI spine. May need a muscle biopsy pending EMG.    Casandra Doffing, MD

## 2016-08-04 ENCOUNTER — Ambulatory Visit
Admission: RE | Admit: 2016-08-04 | Discharge: 2016-08-04 | Disposition: A | Discharge status: Home / Self Care | Payer: BC Managed Care – PPO | Source: Ambulatory Visit | Attending: Neurology | Admitting: Neurology

## 2016-08-04 DIAGNOSIS — M609 Myositis, unspecified: Secondary | ICD-10-CM | POA: Insufficient documentation

## 2016-08-04 DIAGNOSIS — R29898 Other symptoms and signs involving the musculoskeletal system: Secondary | ICD-10-CM | POA: Insufficient documentation

## 2016-08-04 DIAGNOSIS — M629 Disorder of muscle, unspecified: Secondary | ICD-10-CM | POA: Insufficient documentation

## 2016-08-16 ENCOUNTER — Ambulatory Visit
Payer: BC Managed Care – PPO | Attending: Dermatology | Admitting: Student in an Organized Health Care Education/Training Program

## 2016-08-16 ENCOUNTER — Ambulatory Visit (HOSPITAL_BASED_OUTPATIENT_CLINIC_OR_DEPARTMENT_OTHER): Payer: BC Managed Care – PPO

## 2016-08-16 VITALS — Temp 98.1°F | Ht 69.61 in | Wt 276.9 lb

## 2016-08-16 DIAGNOSIS — M3320 Polymyositis, organ involvement unspecified: Secondary | ICD-10-CM | POA: Insufficient documentation

## 2016-08-16 DIAGNOSIS — D485 Neoplasm of uncertain behavior of skin: Secondary | ICD-10-CM

## 2016-08-16 DIAGNOSIS — M3390 Dermatopolymyositis, unspecified, organ involvement unspecified: Secondary | ICD-10-CM

## 2016-08-16 DIAGNOSIS — Z6841 Body Mass Index (BMI) 40.0 and over, adult: Secondary | ICD-10-CM

## 2016-08-16 DIAGNOSIS — Z79899 Other long term (current) drug therapy: Secondary | ICD-10-CM | POA: Insufficient documentation

## 2016-08-16 DIAGNOSIS — C4359 Malignant melanoma of other part of trunk: Secondary | ICD-10-CM | POA: Insufficient documentation

## 2016-08-16 DIAGNOSIS — Z808 Family history of malignant neoplasm of other organs or systems: Secondary | ICD-10-CM | POA: Insufficient documentation

## 2016-08-16 NOTE — Nursing Note (Signed)
08/16/16 1000   Medication Administration   Initials jm   Witness Initials jm   Medication  Lidocaine   Medication Dose 1cc   Route of Administration Intradermal   NDC # 9855289400   LOT # [id number]   Expiration date 07/05/19   Manufacturer [employer]   Clinic Supplied Yes   Patient Supplied No   Comments: Admin by Dr.Gwynne

## 2016-08-16 NOTE — Progress Notes (Signed)
Subjective:       Patient ID: Victor Frazier is a 36 y.o. male     Chief Complaint:     Chief Complaint   Patient presents with   . Skin Check        HPI   36 y.o. white male with no history of skin cancer and unknown family history of skin cancer presents for irritated lesion on his back. He thinks the mole has gotten slightly larger. Has not bled. He notes he still has weakness in his left leg and is following now with [facility] rheumatology and has appt with neurology to do EMG on his normal leg. OTherwise  denies any new, changing, bleeding, or rapidly growing lesions and has no other skin-related complaints.    Review of Systems   Constitutional: Negative for chills and fever.   Skin: Negative for rash and wound.     Current Outpatient Prescriptions   Medication Sig   . metoprolol (LOPRESSOR) 25 mg Oral Tablet Take 25 mg by mouth Once a day       Objective:   Marland Kitchen     Vitals:    08/16/16 0948   Temp: 36.7 C (98.1 F)   Weight: 125.6 kg (276 lb 14.4 oz)   Height: 1.768 m (5' 9.61")       Physical Exam   Constitutional: He appears well-developed and well-nourished. No distress.   Skin: Skin is warm and dry.          General skin exam was performed including head, neck, anterior and posterior trunk, bilateral upper, and lower extremities and revealed no areas of concern other than those documented.        Assessment & Plan:     Neoplasm of uncertain behavior most likely benign nevus vs irritated nevus vs r/o melanoma (#1)  - TIME OUT: A time out was performed to confirm the correct patient, procedure, and site. Consent obtained, area cleaned, and anesthetized. A shave biopsy was performed.  Aluminum chloride was used for hemostasis. Vaseline and bandage were placed over the wound and wound care instructions were given. Patient demonstrated understanding of the instructions. Patient was advised that it would take approximately 2-3 weeks for the pathology results to be available and that I will personally contact the  patient at the number provided to further discuss the pathology results.    Localized polymyositis   - Following with rheumatology.  - No rash today or ever.  - Will call if develops any skin symptoms.    Mannie Stabile, MD  08/16/2016, 15:31    See resident's note for details. I saw and examined the patient and agree with the resident's findings and plan as written except as noted and I was present and supervised/observed the entire procedure.    Bonnetta Barry, MD

## 2016-08-18 LAB — HISTORICAL SURGICAL PATHOLOGY SPECIMEN

## 2016-08-26 ENCOUNTER — Other Ambulatory Visit (HOSPITAL_BASED_OUTPATIENT_CLINIC_OR_DEPARTMENT_OTHER): Payer: Self-pay | Admitting: Student in an Organized Health Care Education/Training Program

## 2016-08-26 DIAGNOSIS — C439 Malignant melanoma of skin, unspecified: Secondary | ICD-10-CM

## 2016-08-30 ENCOUNTER — Ambulatory Visit (INDEPENDENT_AMBULATORY_CARE_PROVIDER_SITE_OTHER): Payer: BC Managed Care – PPO | Admitting: Rheumatology

## 2016-08-30 ENCOUNTER — Ambulatory Visit (HOSPITAL_COMMUNITY)
Admission: RE | Admit: 2016-08-30 | Discharge: 2016-08-30 | Disposition: A | Discharge status: Home / Self Care | Payer: BC Managed Care – PPO | Source: Ambulatory Visit

## 2016-08-30 ENCOUNTER — Encounter (INDEPENDENT_AMBULATORY_CARE_PROVIDER_SITE_OTHER): Payer: Self-pay | Admitting: SURGICAL ONCOLOGY

## 2016-08-30 ENCOUNTER — Ambulatory Visit
Admission: RE | Admit: 2016-08-30 | Discharge: 2016-08-30 | Disposition: A | Discharge status: Home / Self Care | Payer: BC Managed Care – PPO | Source: Ambulatory Visit | Attending: SURGICAL ONCOLOGY | Admitting: SURGICAL ONCOLOGY

## 2016-08-30 ENCOUNTER — Ambulatory Visit (HOSPITAL_BASED_OUTPATIENT_CLINIC_OR_DEPARTMENT_OTHER): Payer: BC Managed Care – PPO | Admitting: SURGICAL ONCOLOGY

## 2016-08-30 ENCOUNTER — Encounter (HOSPITAL_COMMUNITY): Payer: Self-pay

## 2016-08-30 VITALS — BP 128/86 | HR 80 | Temp 97.3°F | Resp 20 | Ht 69.45 in | Wt 273.4 lb

## 2016-08-30 VITALS — BP 122/76 | HR 99 | Temp 97.5°F | Ht 69.0 in | Wt 274.0 lb

## 2016-08-30 DIAGNOSIS — C4359 Malignant melanoma of other part of trunk: Secondary | ICD-10-CM

## 2016-08-30 DIAGNOSIS — I10 Essential (primary) hypertension: Secondary | ICD-10-CM

## 2016-08-30 DIAGNOSIS — C439 Malignant melanoma of skin, unspecified: Secondary | ICD-10-CM

## 2016-08-30 DIAGNOSIS — Z01818 Encounter for other preprocedural examination: Secondary | ICD-10-CM

## 2016-08-30 DIAGNOSIS — Z6841 Body Mass Index (BMI) 40.0 and over, adult: Secondary | ICD-10-CM

## 2016-08-30 DIAGNOSIS — Z87891 Personal history of nicotine dependence: Secondary | ICD-10-CM | POA: Insufficient documentation

## 2016-08-30 HISTORY — DX: Personal history of other specified conditions: Z87.898

## 2016-08-30 HISTORY — DX: Heartburn: R12

## 2016-08-30 HISTORY — DX: Presence of spectacles and contact lenses: Z97.3

## 2016-08-30 HISTORY — DX: Malignant (primary) neoplasm, unspecified (CMS HCC): C80.1

## 2016-08-30 LAB — POC BLOOD GLUCOSE (RESULTS): GLUCOSE, POC: 92 mg/dL (ref 70–105)

## 2016-08-30 LAB — BASIC METABOLIC PANEL
ANION GAP: 12 mmol/L (ref 4–13)
BUN/CREA RATIO: 20 (ref 6–22)
BUN: 16 mg/dL (ref 8–25)
CALCIUM: 9.9 mg/dL (ref 8.5–10.2)
CHLORIDE: 107 mmol/L (ref 96–111)
CO2 TOTAL: 23 mmol/L (ref 22–32)
CREATININE: 0.79 mg/dL (ref 0.62–1.27)
ESTIMATED GFR: >59 mL/min/1.73mˆ2 (ref >59)
GLUCOSE: 96 mg/dL (ref 65–139)
POTASSIUM: 4.5 mmol/L (ref 3.5–5.1)
SODIUM: 142 mmol/L (ref 136–145)

## 2016-08-30 LAB — TYPE AND SCREEN
ABO/RH(D): O POS
ANTIBODY SCREEN: NEGATIVE

## 2016-08-30 LAB — ECG 12-LEAD (PERFORMED IN PREADMISSION UNIT ONLY)
Calculated R Axis: 37 degrees
Calculated T Axis: 39 degrees
PR Interval: 148 ms
QRS Duration: 96 ms
QT Interval: 382 ms

## 2016-08-30 NOTE — Anesthesia Preprocedure Evaluation (Addendum)
ANESTHESIA PRE-OP EVALUATION  Review of Systems     Physical Assessment      Airway       Mallampati: II    TM distance: >3 FB    Neck ROM: full  Mouth Opening: good.  Facial hair  Beard  No endotracheal tube present  No Tracheostomy present    Dental       Dentition intact             Pulmonary    Breath sounds clear to auscultation  (-) no rhonchi, no decreased breath sounds, no wheezes, no rales and no stridor     Cardiovascular    Rhythm: regular  Rate: Normal  (-) no friction rub and no murmur     Other findings            Plan  Planned anesthesia type: general    ASA 3         Anesthetic plan and risks discussed with patient.                                     EKG Ordered: 08/30/2016  CXR: Not ordered  Other Studies: Labs 08/30/16    Consults: None    Patient instructed to hold vitamins, supplements, and NSAIDs 7 days prior to surgery; takes metoprolol at night so will take the evening prior to surgery.    Copy of Anesthesia Consent given to patient.  Patient instructed to read over consent and to bring any questions for AM of surgery.

## 2016-08-30 NOTE — H&P (Signed)
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    CHIEF COMPLAINT: "Melanoma"    HISTORY OF PRESENT ILLNESS:  Victor Frazier is a 36 y.o.    White  Unknown male who presents as a new patient to my clinic today for evaluation of a new melanoma located on the right upper back. The patient initially presented to a dermatologist with symptoms of pruritis and soreness of a new lesion (4-6 month history) of the right upper back. The lesion was biopsied, and pathology demostrated a 1.1 mm melanoma, nevoid subtype, without ulceration, and a mitotic rate of 1/mm2. For this reason, he was referred to our office for surgical evaluation. He denies any systemic symptoms such as headaches, visual changes, abdominal pain, vomiting, fever, chills, night sweats, or weight loss. He is currently being worked up for muscle weakness by neurology and rheumatology.    MELANOMA RISK FACTORS:  Personal hx of melanoma: No  Personal hx of skin cancer: No  Personal hx of new or changing/dysplastic moles: No  Personal hx of multiple sunburns: Yes  Personal hx of blistering sunburns: Yes  Personal hx of tanning bed use: Yes  Family hx of melanoma: N/A - patient adopted  Family hx of dysplastic moles: N/A - patient adopted  Currently wears sunscreen: Yes  Occupation: Gaffer    REVIEW OF SYSTEMS:  Constitutional: endorses no pertinent postitives; denies fevers, chills, night sweats, anorexia, weight gain and weight loss  HEENT: endorses no pertinent postivies; denies headache, dizziness, vertigo, blurred/double vision, cataracts, glaucoma, bleeding gums, changes in voice and difficulty swallowing  Cardiovascular: endorses no pertinent positives; denies chest pain, prior heart attack, heart murmur,  palpitations, irregular heart beat, prior cardiac surgery, prior cardiac stenting and prior rheumatic fever  Respiratory: endorses no pertinent positives; denies SOB, cough, sputum production, asthma, COPD, emphysema, sleep apnea, requires oxygen and prior TB  Gastrointestinal (GI): endorses no pertinent positives; denies abdominal pain, nausea, vomiting, diarrhea, constipation, hematochezia, melena, hematemesis, GERD, PUD and colonoscopy/endoscopy  Genitourinary (GU): endorses no pertinent positives; denies dysuria, hematuria, frequent UTIs and kidney stones  Musculoskeletal: endorses weakness in arms/legs; denies arthritis, rheumatism and back pain  Integumentary: endorses no pertinent positives; denies prior skin cancer, pigmented lesions and scleroderma   Hematologic: endorses no pertinent positives; denies prior PE, prior DVT, excessive bleeding, known bleeding diathesis and lymphadenopathy  Endocrine: endorses no pertinent postivies; denies diabetes, thyroid disease, polyuria and polydipsia  Neurological: endorses no pertinent positives; denies prior TIA, prior stroke, prior seizures/fainting and prior carotid surgery  Psychiatric: endorses no pertinent positives; denies anxiety, depression and difficulty sleeping    PAST MEDICAL HISTORY:  Past Medical History:   Diagnosis Date   . HTN (hypertension)    . Malignant melanoma of upper back ([place])        PAST SURGICAL HISTORY:  Past Surgical History:   Procedure Laterality Date   . EXCISION BENIGN SKIN LESION TRUNK / ARM / LEG      birthmark off left upper back   . HX LAP CHOLECYSTECTOMY     . HX TONSILLECTOMY     . ORBITAL RECONSTRUCTION Left        FAMILY HISTORY:  Family Medical  History     Problem Relation (Age of Onset)    No Known Problems Other          SOCIAL HISTORY:  The patient is divorced and works as an Paramedic at [employer]. He drinks ETOH socially and quit smoking cigarettes 5 years ago.    HOME MEDICATIONS:  Current Outpatient Prescriptions    Medication Sig   . metoprolol (LOPRESSOR) 25 mg Oral Tablet Take 25 mg by mouth Once a day       ALLERGIES:  No Known Allergies       PHYSICAL EXAMINATION:  ECOG Performance Status: 0 - Asymptomatic  Karnofsky Performance Status: 100% - normal, no complaints, no signs of disease  General: Pleasant, well-developed, well-nourished    White  Unknown male who does appear his stated age and is in no acute distress.    Vital Signs: BP 122/76  Pulse 99  Temp 36.4 C (97.5 F) (Thermal Scan)   Ht 1.753 m (5\' 9" )  Wt 124.3 kg (274 lb 0.5 oz)  SpO2 97% Comment: room air  BMI 40.47 kg/m2  HEENT: Normocephalic, atraumatic, PERRLA, EOMI.  Neck: Supple, trachea is midline.  Lymphatics: No palpable lymphadenopathy of the cervical, supraclavicular, or axillary nodal basins bilaterally.  Cardiovascular: RRR without murmurs, rubs, or gallops. S1, S2 normal.   Pulmonary: Lungs are CTA bilaterally. No wheezes, rales, or rhonchi. Normal effort, chest expands symmetrically.  Extremities: Without deformity, cyanosis, or edema.  Skin: Warm and dry.   Neurologic: AAOx3; Cranial nerves grossly intact, no focal motor or sensory deficits  Psychiatric: Speech pattern and movements are normal, normal mood, affect and judgment    LABORATORY REVIEW:  None.    OTHER STUDIES REVIEW:  None.    PATHOLOGY REVIEW:  1. 08/16/16 - [place]: SKIN, RIGHT BACK, SHAVE BIOPSY:Malignant melanoma (see Cancer Case Summary).  CANCER CASE SUMMARY  Procedure: Biopsy, shave.  Specimen laterality: Right.  Tumor site: Back.  Microscopic satellite nodules: None identified.  Histologic type: Nevoid melanoma.  Maximum tumor thickness: 1.1 mm.  Ulceration: Not identified.  Microsatellites: Not identified.  Margins: Uninvolved.    Melanoma in-situ comes to within <1 mm of a peripheral edge.  Mitotic rate: 1/mm2.  Lymphovascular invasion: Not identified.  Neurotrophism: Not identified.  Tumor infiltrating lymphocytes: Present, non-brisk.  Tumor regression:  Not identified.  Pathologic stage: pT2a, Nx.    ASSESSMENT:  36 y.o.    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    PLAN:  1. Dr. Huel Cote and I have explained the incidence, epidemiology, and biology of malignant melanoma in detail. These tumors are malignant skin cancers originating from the pigment producing melanocytes in the basal layer of the epidermis of the skin. While less common than other types of skin cancer, it is tyically more aggressive and has a proclivity for distant spread. Risk factors for melanoma include a positive family history of melanoma, personal history of prior melanoma, multiple clinically atypical moles or dysplastic nevi, and rarely inherited genetic mutations. Fair skin and significant sun exposure are also independent risk factors for melanoma. However, this tumor can occur in any ethnic group and also in areas of the body without substantial sun exposure.  2. As with nearly all malignancies, the outcomes of patients with melanoma rest upon the stage of disease at presentation. Thus, the pathologic staging system was reviewed in depth. We discussed the implications of primary tumor factors such as depth, ulceration,  mitotic rate and satellitosis, which are associated with a higher risk of local recurrence along with regional and distant metastatic spread. The substantial impact of involved lymph nodes was also explained.   3. Regarding treatment options, we discussed radical excision of the primary melanoma with appropriate margins. Based on the invasive depth of the primary tumor (1.1 mm), this would be at least 1-2 cm circumferentially around the primary site.  4. The indications and rationale for sentinel lymph node biopsy, otherwise known as lymphatic mapping/sentinel lymphadenectomy, were also discussed. Currently, taking into consideration the depth of the lesion, which is 1.1 mm, in addition to the mitotic rate/presence of  ulceration, the patient is a pT2a, requiring a SLN bx. In addition, per a high-powered and well validated nomogram developed by [facility] ([employer]de), his risk for a positive lymph node in the first drainage basin is up to 8% (depending on Clarks level, which unfortunately was not reported; this is based on a possible Clarks level V). Dr. Huel Cote described the technique of sentinel lymph node biopsy and lymphatic mapping including preoperative lymphoscintigraphy by nuclear medicine, intra-operative injection of Lymphazurin (blue dye) and intraoperative lymphatic mapping. The risks of the procedure were explained as well, including but not limited to permanent skin tattooing, green urine (typically self limited to 48 hours), bleeding, infection, hematoma, seroma, reaction to the Lymphazurin, nerve injury and a 6-12% risk of chronic lymphedema along with anesthetic complications such as acute MI, stroke, and death. We also discussed that final treatment recommendations can only be determined following complete pathologic evaluation of the resected specimen and lymph nodes following the procedure. The patient understands that further surgical therapy may be warranted, as the gold standard for positive lymph nodes in this situation is for completion lymphadenectomy. After deliberation, the patient and family members agree to proceed as recommended and have signed informed consent.  5. We shall plan for wide local excision with sentinel lymph node biopsy at the earliest possible convenience.  He will require plastic surgical closure given the location of the lesion on the right upper back (overlying the scapula).  6. We discussed the chance of recurrence of the tumor in both the same and new locations, which can occur anywhere in their body at any time. In addition, the patient understands that there is a 10% chance of developing a new primary melanoma within their  lifetime.   7. Further follow-up will be discussed at his initial postoperative appointment, which will be in congruence with NCCN guidelines depending on the final pathological stage. However, we did discuss that close dermatologic surveillance will be necessary due to his now heightened risk of melanoma recurrence. He should continue using sunscreen and appropriate sun protection if exposed to sunlight in the future.   8. Surgery will be scheduled at the earliest possible convenience after his trip to [place] from 09/16/16 - 09/21/16. He will also need to see Dr. Liliane Bade for plastics closure due to tension in the area. Following our appointment today, he will proceed to PAT for appropriate preoperative work-up (blood work, EKG, CXR) in anticipation of the procedure. Dr. Roland Rack business card was provided, outlining general information including our contact numbers if any questions or concerns arise prior to the procedure. Patient was seen in conjunction with cosigning faculty, Dr. Huel Cote, at the visit today.    [address] Thayer Ohm 08/30/2016, 09:56  Physician Assistant Certified  [facility]    I saw and examined this patient with the physician  assistant above.  Please see the physician assistant's note, which I have carefully reviewed, for full details. I agree with the findings and plan of care as documented in the physician assistant's note.  Any additions/exceptions are edited/noted.    Very pleasant 36 y/o male with a newly diagnosed melanoma of the right upper back.  Otherwise in his usual state of health.  On exam, there is a 1 cm shave biopsy of the right upper back, no nodularity, no pigmentation, no palpable lymphadenopathy.  Pathology demonstrates a 1.1 mm melanoma, margins negative, no ulceration, 1 mitotic figure.  Plan for wide local excision with sentinel lymph node biopsy at the earliest possible convenience.  He will need Dr. Liliane Bade with plastic surgery for closure  options - he may have enough tissue for local rotational flap closure (from the right flank/underarm).    Illene Silver, MD 08/30/2016 11:15  Assistant Professor of Surgery  Division of [place]

## 2016-09-07 ENCOUNTER — Encounter (HOSPITAL_BASED_OUTPATIENT_CLINIC_OR_DEPARTMENT_OTHER): Payer: Self-pay | Admitting: Plastic Surgery

## 2016-09-07 ENCOUNTER — Ambulatory Visit: Payer: BC Managed Care – PPO | Attending: Plastic Surgery | Admitting: Plastic Surgery

## 2016-09-07 VITALS — BP 136/83 | HR 85 | Temp 98.3°F | Ht 69.0 in | Wt 276.7 lb

## 2016-09-07 DIAGNOSIS — C4359 Malignant melanoma of other part of trunk: Secondary | ICD-10-CM | POA: Insufficient documentation

## 2016-09-07 DIAGNOSIS — I10 Essential (primary) hypertension: Secondary | ICD-10-CM | POA: Insufficient documentation

## 2016-09-07 DIAGNOSIS — Z01818 Encounter for other preprocedural examination: Secondary | ICD-10-CM | POA: Insufficient documentation

## 2016-09-07 DIAGNOSIS — Z6841 Body Mass Index (BMI) 40.0 and over, adult: Secondary | ICD-10-CM

## 2016-09-07 DIAGNOSIS — Z79899 Other long term (current) drug therapy: Secondary | ICD-10-CM | POA: Insufficient documentation

## 2016-09-07 DIAGNOSIS — Z87891 Personal history of nicotine dependence: Secondary | ICD-10-CM | POA: Insufficient documentation

## 2016-09-07 NOTE — Patient Instructions (Signed)
[facility]; Dept. of Surgery  Plastics/Reconstruction and Hand   Pre-Op Instruction Sheet    To Whom This May Concern,  . Your surgery date is October 01, 2016.  If you don't have a surgery date, our scheduler, will be contacting you with a date.        . Your post-operative appointment date will be determined at the [facility] office.    . The [facility] staff will contact you the day before your surgery with your arrival time and any additional instructions regarding your surgery.  The [facility] staff begins to make these calls to the patients around 12:00pm.  The staff will leave a message on your answering machine if you are not available.    . You are to have nothing to eat or drink as directed by anesthesia prior to your procedure.  This includes the use of tobacco.  This is generally midnight before your procedure.    . On the day of your surgery, report to the 1st floor Lobby of [facility] to register.  Once you are registered, you should be directed which floor to go to.  Family members may wait with you until you go into the operating room, they will then be directed to the family waiting rooms until after surgery.  Once your family arrives in the waiting area, have them check in with the volunteer.  After your surgery, the physician will come to the waiting area to discuss your condition with your family.    . Please call the [facility] [phone] and/or the Plastics Office [phone] if you are unable to report for surgery.  Please let us know as soon as possible so that modifications can be made.    . You must have a responsible adult to stay with you for 24 hours in case there are any post-operative complications.     . Avoid taking NSAIDS's - Motrin, Advil, naproxen 72 hours prior to surgery.  Stop taking- Aspirin 7 days before surgery.    ADDITIONAL INSTRUCTIONS:    . If you have any questions between now and your surgery date, please feel free to call the Plastics/  Reconstruction/Hand Surgery Office at [phone].    Lowella Curb, RN  Clinical Nurse Coordinator  Department of Surgery  Plastics/Reconstruction/Hand Surgery  Email:  [email or web address]   Skin Grafting  Skin grafting is a surgical procedure to cover an area of damaged or missing skin with a piece of healthy skin from another area of the body (donor site) or from a donor. You may have a graft using skin from:  . Another part of your body (autograft).  . The body of another person (allograft).  . An animal's body (xenograft).  You may need a skin graft if you have lost a large area of skin from a burn, wound, or pressure sore. You may also need skin grafting if you had a large piece of skin removed as a result of surgery. Skin grafting can help your skin to heal, prevent large scars, and prevent infection.  The three main types of skin grafts are:  . Split-thickness skin graft. This option works for wounds that are not deep. It is a graft that contains the top skin layer (epidermis) and a portion of the skin that contains blood vessels, nerves, hair follicles, and oil and sweat glands (dermis).  . Full-thickness skin graft. This option is best for deep wounds or severe burns. It is a graft  that requires all layers of skin as well as some supporting tissues underneath. When this type is done, a split-thickness graft may be used to cover the donor site.  . Composite skin graft. This type is used for grafts that need more reconstruction. The graft might include cartilage and fat as well as skin.  LET [facility] CARE PROVIDER KNOW ABOUT:  . Any allergies you have.  . All medicines you are taking, including vitamins, herbs, eye drops, creams, and over-the-counter medicines.  . Previous problems you or members of your family have had with the use of anesthetics.  . Any blood disorders you have.  . Previous surgeries you have had.  . Medical conditions you have.  RISKS AND COMPLICATIONS  Generally, this is a safe  procedure. However, problems may occur, including:  . Infection.  . Loss of grafted skin.  . Bleeding.  . Blood under the skin (hematoma).  . Scarring.  . Need for additional grafts.  BEFORE THE PROCEDURE  . Ask your health care provider about:   . Changing or stopping your regular medicines. This is especially important if you are taking diabetes medicines or blood thinners.  . Taking medicines such as aspirin and ibuprofen. These medicines can thin your blood. Do not take these medicines before your procedure if your health care provider instructs you not to.  . Follow your health care provider's instructions about eating or drinking restrictions.  . Plan to have someone take you home after the procedure.  . Take a shower on the morning of the procedure. You may have to use a certain cleanser as specified by your health care provider.  PROCEDURE    For a split-thickness graft:  . An IV tube will be inserted into one of your veins.  . You will be given a medicine that makes you fall asleep (general anesthetic).  . Your wound will be cleaned to make sure it is free of dirt and to lower the risk of infection.  . Your surgeon will stop the flow of blood to the wound.  . Using a surgical tool called a dermatome, the surgeon will cut the epidermis and a small layer of dermis from the donor site. This piece will be slightly larger than the wound.  . The donated tissue will be placed over the wound. It will be held in place with a pressure wrap, stitches (sutures), or both.  . The site of the donated tissue will be covered with clean bandages (dressings) to protect against infection.  The procedure may vary among health care providers and hospitals.  For a full-thickness graft or a composite graft:  . An IV tube will be inserted into one of your veins.  . You will be given a medicine that makes you fall asleep (general anesthetic).  . Your wound will be cleaned to make sure it is free of dirt and to lower the risk of  infection.  . Your surgeon will stop the flow of blood to the wound.  . Using a scalpel, the surgeon will cut out a section of skin, muscle, fat, and blood supply. This graft will be trimmed and then placed over the wound.  . The donated tissue will be held in place with absorbable stitches (sutures). A pressure wrap may also be used.  Marland Kitchen A split-thickness graft might be done to cover the donor site.  The procedure may vary among health care providers and hospitals.  AFTER THE PROCEDURE  . You  will be moved to a recovery area.  . Your blood pressure, heart rate, breathing rate, and blood oxygen level will be monitored often until the medicines you were given have worn off.  . You may be given antibiotic medicines.     This information is not intended to replace advice given to you by your health care provider. Make sure you discuss any questions you have with your health care provider.     Document Released: 02/18/2005 Document Revised: 07/12/2014 Document Reviewed: 03/06/2014  Elsevier Interactive Patient Education [employer].        Role of Plastic Surgery in treatment of skin cancers: melanoma and non-melanoma    Plastic surgeons usually work in collaboration with surgical oncologists and dermatologists in the treatment of skin cancers. Skin cancers when removed will require a margin of resection around the tumor to minimize the chances of those tumors coming back. Sometimes, margins can be extensive, cross areas that are cosmetic sensitive such as cheeks or cross areas that may lead to function changes such as eyes or mouth. Because of that, some patients may require excision of the tumor and some type of reconstruction.    The most common types of reconstruction are: skin grafts and local flaps.   A skin graft is when a "piece of skin" is either shaved or removed from an area of the body to cover another area. The advantage of skin grafts is that it can cover larger areas without a lot of comorbidity/  functional problems to the donor site (area where the skin was removed). The disadvantage is that it will survive on the blood flow of the defect (area that it is covering) to survive in the first 5-7 days. During this period, patients will have a bolster that will hold the skin graft tight to the wound bed. After 5-7 days the bolster is removed and the skin graft is assessed to determine how much of it worked and if there will be any open areas. Sometimes, depending on the depth of the defect, patients may have contour irregularities and/or different tissue texture/ color with skin grafts.     A local flap is when a tissue adjacent to the area of the defect is mobilized to close the defect. The advantage is that it doesn't need a bolster, replaces similar tissue and it doesn't lead to contour irregularities. However, it may lead to larger scars, scars in other areas of the body and the surgical time will increase as flaps require larger areas of dissection.           In most cases, plastic surgeons work with surgical oncologists during the same surgery. As soon as the tumor is removed, the plastic surgeon comes and closes the defect. Sometimes, the defects can't be closed during the time of resection, for example if there is a question of possible positive margins and in this case, patients need to return to surgery for closure in another time.    Finally, not all patients will require a plastic surgeon for assistance with closure of a defect. The decision will be based on type of tumor, location, size and discussion between patients and surgeon oncologists.      How to care for your reconstructive surgery    Skin grafts:  1. You can shower, but the bolster placed over your graft can NOT get wet. You will need to protect it by avoiding direct water over the bolster or covering with a plastic bag/plastic (saran)  wrap.  2. Your bolster will be removed in the clinic in 5-7 days and after that you should be able to get  the graft wet.  3. Skin grafts are fragile within the first 2 weeks from surgery and they should be protected from shearing.  4. OK to apply a thin layer of Vaseline cream or antibiotic ointment to protect the graft during this period  5. The donor site may get wet or not depending on type of graft and this will be a discussion between patient and surgeon    Flaps:  1. Ok to shower without bandages/ dressings in 36-48hours  2. Sometimes you may have a drain and this will be temporary to avoid fluid to accumulate in the area of the flap. They usually stay for about 7-10 days  3. You may have stitches that will be removed in 7-14 days. From the surgical day to your follow up it is Ok to apply an antibiotic ointment over the incision site 2-3 times/day    You will experience some discomfort after surgery. You may take your pain medications and add anti-inflammatory pain killers such as ibuprofen, Motrin, Advil, Aleve to decrease swelling and bruising which will help improve your pain level.    If you have any signs of fever (T>101F), chills, malaise, redness around incisions, worsening of pain, worsening of swelling, drainage through incision or any concerns, call your surgeon. The Plastic Surgery main office number is: [phone].      Dr. Ginger Carne Postoperative Care for Skin Grafts    - The skin graft will be covered with xeroform, wrapped with Kerlix and ace wrap or  If it has a Bolster, please DO NOT remove the bolster or get it wet.  Keep DRY!!!  - OKAY to take dressing down on       .    WOUND CARE starting on       :  - Clean area with water and soap  - DO NOT rub the area  - Apply xeroform over skin graft  - Wrap with kerlix and ACE wrap  - Apply prosthetic if applicable    Restrictions:  - AVOID any flexion for 7-10 days to minimize shearing of skin graft  - NO heavy exercises/ running, jogging/ jumping for 4 weeks  - NO swimming in lakes/ rivers/ swimming pool/ bath tubs until wounds are healed    Return to  clinic in 7-10 days for staple removal or return to local PCP    If any questions, please call PLASTIC SURGERY OFFICE AT [phone]  Skin Grafting, Care After  Refer to this sheet in the next few weeks. These instructions provide you with information about caring for yourself after your procedure. Your health care provider may also give you more specific instructions. Your treatment has been planned according to current medical practices, but problems sometimes occur. Call your health care provider if you have any problems or questions after your procedure.  WHAT TO EXPECT AFTER THE PROCEDURE  After your procedure, it is common to have:  . Pain.  . Swelling.  HOME CARE INSTRUCTIONS  . Take medicines only as directed by your health care provider.  . Follow your health care provider's instructions about:   . Care of your graft and your donor site.  . Bandage (dressing) changes and removal.  . Wound closure removal.  . Check your graft and your donor site every day for signs of infection. Watch for:   .  Redness, swelling, or pain.  . Fluid, blood, or pus.  . You may need to wear a supportive bandage or wrap for several weeks.  . Do not take baths, swim, or use a hot tub until your health care provider approves.  . Do not exercise or do any other activities that could stretch the graft. Ask your health care provider when it will be safe for you to exercise again.  Marland Kitchen Keep all follow-up visits as directed by your health care provider. This is important.  SEEK MEDICAL CARE IF:  . You have a fever.  . You have redness, swelling, or pain at your graft or your donor site.  . You have fluid, blood, or pus coming from your graft or your donor site.  . Your pain gets worse.  SEEK IMMEDIATE MEDICAL CARE IF:  . You have chest pain.  . You feel short of breath or you start coughing.  . You feel dizzy.     This information is not intended to replace advice given to you by your health care provider. Make sure you discuss any  questions you have with your health care provider.     Document Released: 07/12/2014 Document Reviewed: 07/12/2014  Elsevier Interactive Patient Education [employer].

## 2016-09-08 ENCOUNTER — Encounter (HOSPITAL_COMMUNITY): Payer: Self-pay
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
## 2016-09-09 ENCOUNTER — Ambulatory Visit (HOSPITAL_COMMUNITY): Payer: BC Managed Care – PPO | Admitting: NEUROLOGY

## 2016-09-09 ENCOUNTER — Inpatient Hospital Stay
Admission: RE | Admit: 2016-09-09 | Discharge: 2016-09-09 | Disposition: A | Discharge status: Home / Self Care | Payer: BC Managed Care – PPO | Source: Ambulatory Visit | Attending: NEUROLOGY | Admitting: NEUROLOGY

## 2016-09-09 ENCOUNTER — Encounter (HOSPITAL_BASED_OUTPATIENT_CLINIC_OR_DEPARTMENT_OTHER): Payer: Self-pay | Admitting: Rheumatology

## 2016-09-09 ENCOUNTER — Encounter (HOSPITAL_COMMUNITY): Payer: Self-pay

## 2016-09-09 ENCOUNTER — Ambulatory Visit (HOSPITAL_BASED_OUTPATIENT_CLINIC_OR_DEPARTMENT_OTHER): Payer: BC Managed Care – PPO | Admitting: Rheumatology

## 2016-09-09 ENCOUNTER — Encounter (HOSPITAL_COMMUNITY)
Admission: RE | Disposition: A | Discharge status: Home / Self Care | Payer: Self-pay | Source: Ambulatory Visit | Attending: NEUROLOGY

## 2016-09-09 ENCOUNTER — Other Ambulatory Visit (HOSPITAL_BASED_OUTPATIENT_CLINIC_OR_DEPARTMENT_OTHER): Payer: BC Managed Care – PPO

## 2016-09-09 VITALS — BP 128/76 | HR 81 | Temp 98.0°F | Ht 69.0 in | Wt 275.6 lb

## 2016-09-09 DIAGNOSIS — Z79899 Other long term (current) drug therapy: Secondary | ICD-10-CM | POA: Insufficient documentation

## 2016-09-09 DIAGNOSIS — C439 Malignant melanoma of skin, unspecified: Secondary | ICD-10-CM

## 2016-09-09 DIAGNOSIS — M62552 Muscle wasting and atrophy, not elsewhere classified, left thigh: Secondary | ICD-10-CM | POA: Insufficient documentation

## 2016-09-09 DIAGNOSIS — J320 Chronic maxillary sinusitis: Secondary | ICD-10-CM | POA: Insufficient documentation

## 2016-09-09 DIAGNOSIS — D0359 Melanoma in situ of other part of trunk: Secondary | ICD-10-CM | POA: Insufficient documentation

## 2016-09-09 DIAGNOSIS — G729 Myopathy, unspecified: Secondary | ICD-10-CM

## 2016-09-09 DIAGNOSIS — M609 Myositis, unspecified: Principal | ICD-10-CM

## 2016-09-09 DIAGNOSIS — R748 Abnormal levels of other serum enzymes: Secondary | ICD-10-CM | POA: Insufficient documentation

## 2016-09-09 DIAGNOSIS — Z6841 Body Mass Index (BMI) 40.0 and over, adult: Secondary | ICD-10-CM

## 2016-09-09 DIAGNOSIS — M6250 Muscle wasting and atrophy, not elsewhere classified, unspecified site: Secondary | ICD-10-CM | POA: Insufficient documentation

## 2016-09-09 DIAGNOSIS — Z7952 Long term (current) use of systemic steroids: Secondary | ICD-10-CM

## 2016-09-09 DIAGNOSIS — Z87891 Personal history of nicotine dependence: Secondary | ICD-10-CM | POA: Insufficient documentation

## 2016-09-09 DIAGNOSIS — J309 Allergic rhinitis, unspecified: Secondary | ICD-10-CM | POA: Insufficient documentation

## 2016-09-09 DIAGNOSIS — R944 Abnormal results of kidney function studies: Secondary | ICD-10-CM | POA: Insufficient documentation

## 2016-09-09 DIAGNOSIS — M6281 Muscle weakness (generalized): Secondary | ICD-10-CM | POA: Insufficient documentation

## 2016-09-09 DIAGNOSIS — R253 Fasciculation: Secondary | ICD-10-CM | POA: Insufficient documentation

## 2016-09-09 DIAGNOSIS — I10 Essential (primary) hypertension: Secondary | ICD-10-CM | POA: Insufficient documentation

## 2016-09-09 SURGERY — BIOPSY MUSCLE
Anesthesia: Local (Nurse-Monitored) | Wound class: Clean Wound: Uninfected operative wounds in which no inflammation occurred

## 2016-09-09 MED ORDER — LIDOCAINE (PF) 10 MG/ML (1 %) INJECTION SOLUTION
30.0000 mL | Freq: Once | INTRAMUSCULAR | Status: DC | PRN
Start: 2016-09-09 — End: 2016-09-09
  Administered 2016-09-09: 10 mL via INTRAMUSCULAR
  Administered 2016-09-09: 14:00:00 1.5 mL via INTRAMUSCULAR
  Administered 2016-09-09: 4 mL via INTRAMUSCULAR
  Administered 2016-09-09: 1.5 mL via INTRAMUSCULAR

## 2016-09-09 MED ADMIN — lidocaine (PF) 10 mg/mL (1 %) injection solution: INTRAMUSCULAR | @ 14:00:00

## 2016-09-09 SURGICAL SUPPLY — 28 items
ADHESIVE TISSUE EXOFIN 1.0ML_PREMIERPRO EXOFIN (SEALANTS) ×1
APPL 70% ISPRP 2% CHG 10.5ML_CHLRPRP HI-LT ORNG SCRUB TEAL (WOUND CARE/ENTEROSTOMAL SUPPLY) ×1
APPL ISPRP CHG 10.5ML CHLRPRP HI-LT ORNG PREP STRL LF (WOUND CARE SUPPLY) ×1 IMPLANT
CAUTERY OPTH VARI TEMP HOT_AA11 10EA/BX (CAUTERY SUPPLIES) ×2 IMPLANT
CONV USE 23866 - NEEDLE HYPO 27GA 1.5IN STD MONOJECT SS POLYPROP REG BVL LL HUB UL SHRP ANTICORE YW STRL LF  DISP (NEEDLES & SYRINGE SUPPLIES) ×1 IMPLANT
CONV USE ITEM 156524 - ADHESIVE TISSUE EXOFIN 1.0ML_PREMIERPRO EXOFIN (SEALANTS) ×1 IMPLANT
DRAPE 2 LYR ABS 70X40IN MED UN_IV LF DISP SURG BILAMINATE (PROTECTIVE PRODUCTS/GARMENTS) ×2
DRAPE FNFLD SHEET 70X40IN MED PRXM LF  STRL DISP SURG SMS (PROTECTIVE PRODUCTS/GARMENTS) ×2 IMPLANT
DRAPE REINF FNFLD 90X44IN LF  STRL DISP SURG (EQUIPMENT MINOR) ×1 IMPLANT
DRAPE REINF FNFLD 90X44IN LF_STRL DISP SURG (EQUIPMENT MINOR) ×1
GOWN SURG XL AAMI L3 NONREINFO_RCE HKLP CLSR STRL LTX PNK SMS (DGOW)
GOWN SURG XL L3 NONREINFORCE HKLP CLSR STRL LTX PNK SMS 47IN (DGOW) IMPLANT
KIT RM TURNOVER CLEANOP CSTM INFCT CONTROL (KITS & TRAYS (DISPOSABLE)) ×1
KIT RM TURNOVER CLEANOP CSTM I_NFCT CONTROL (KITS & TRAYS (DISPOSABLE)) ×1
KIT RM TURNOVER CLEANOP CUSTOM INFCT CONTROL (KITS & TRAYS (DISPOSABLE)) ×1 IMPLANT
KIT SETUP MINOR SURGICAL (KITS & TRAYS (DISPOSABLE)) ×2 IMPLANT
NEEDLE HYPO  27GA 1.5IN STD MONOJECT SS POLYPROP REG BVL LL (NEEDLES & SYRINGE SUPPLIES) ×1
PAD RELEASE 3 X 4 IN_1050 50/BX (WOUND CARE SUPPLY) ×1 IMPLANT
PAD RELEASE 3 X 4 IN_1050 50/BX (WOUND CARE/ENTEROSTOMAL SUPPLY) ×1
SPONGE GAUZE STRL 4 X 4IN TUB_6939 1280/CS (WOUND CARE SUPPLY) ×1 IMPLANT
SPONGE GAUZE STRL 4 X 4IN TUB_6939 1280/CS (WOUND CARE/ENTEROSTOMAL SUPPLY) ×1
SUTURE 4-0 SH-1 VICRYL+ 27IN VIOL BRD ANBCTRL COAT ABS (SUTURE/WOUND CLOSURE) ×1 IMPLANT
SUTURE 4-0 SH-1 VICRYL+ 27IN V_IOL BRD ANBCTRL ABS (SUTURE/WOUND CLOSURE) ×1
SYRINGE BD LL 10ML LF STRL CO_NTROL CONCEN TIP PRGN FREE (NEEDLES & SYRINGE SUPPLIES) ×1
SYRINGE LL 10ML LF  STRL CONTROL CONCEN TIP PRGN FREE DEHP-FR MED DISP (NEEDLES & SYRINGE SUPPLIES) ×1 IMPLANT
TAPE ELASTIC ELASTIKON 4IN 005177 (MED/SURG TAPES) IMPLANT
WIPE PREP ALC FREE WTPRF LIQUID FILM NSTNG SKNPRP STRL LF  1ML FRGRNC FREE (WOUND CARE SUPPLY) ×1 IMPLANT
WIPE SKIN PREP NON STING_59420600 (WOUND CARE/ENTEROSTOMAL SUPPLY) ×1

## 2016-09-09 NOTE — Care Plan (Signed)
Problem: Patient Care Overview (Adult,OB)  Goal: Plan of Care Review(Adult,OB)  The patient and/or their representative will communicate an understanding of their plan of care   Outcome: Ongoing (see interventions/notes)      Problem: Anxiety (Adult)  Goal: Identify Related Risk Factors and Signs and Symptoms  Related risk factors and signs and symptoms are identified upon initiation of Human Response Clinical Practice Guideline (CPG).   Outcome: Ongoing (see interventions/notes)      Problem: Thought Process Alteration (Adult)  Goal: Identify Related Risk Factors and Signs and Symptoms  Related risk factors and signs and symptoms are identified upon initiation of Human Response Clinical Practice Guideline (CPG).   Outcome: Ongoing (see interventions/notes)      Problem: Perioperative Period (Adult)  Prevent and manage potential problems including:1. bleeding2. gastrointestinal complications3. hypothermia4. infection5. pain6. perioperative injury7. respiratory compromise8. situational response9. urinary retention10. venous thromboembolism11. wound complications   Goal: Signs and Symptoms of Listed Potential Problems Will be Absent or Manageable (Perioperative Period)  Signs and symptoms of listed potential problems will be absent or manageable by discharge/transition of care (reference Perioperative Period (Adult) CPG).   Outcome: Ongoing (see interventions/notes)

## 2016-09-09 NOTE — Progress Notes (Signed)
Subjective  Victor Frazier is a 36 y.o. year old male who presents for Results   to clinic.  Doing ok  Found to have a melanoma  Had EMG and will get a biopsy  Since 31-32  He is going to have a bigger surgery to remove all of the melanoma March 30th  He would prefer not to use anything new till then    ROS: No  fatigue, chest pain, cough, dry eyes, dry mouth, rashes, oral ulcers, fevers, shortness of breath    Current Outpatient Prescriptions   Medication Sig   . metoprolol (LOPRESSOR) 25 mg Oral Tablet Take 25 mg by mouth Every evening        History   Smoking Status   . Former Smoker   . Start date: 08/20/2011     Comment: quit chewing 3 years ago       Family Medical History     Problem Relation (Age of Onset)    No Known Problems Other      Patient is adopted    Objective  Vitals: BP 128/76  Pulse 81  Temp 36.7 C (98 F) (Tympanic)   Ht 1.753 m (5\' 9" )  Wt 125 kg (275 lb 9.2 oz)  SpO2 96%  BMI 40.7 kg/m2  General: appears well - no acute distress  Cardiovascular: no murmurs, RRR, cardiac are sounds are not distant   Lungs: no wheezes, no crackles - breath sounds not decreased   Joints: no active synovitis or deformities -    Extremities: no peripheral edema  Skin: no rashes  No weakness    Labs/X-rays/Work up  Appointment on 08/30/2016   Component Date Value Ref Range Status   . SODIUM 08/30/2016 142  136 - 145 mmol/L Final   . POTASSIUM 08/30/2016 4.5  3.5 - 5.1 mmol/L Final   . CHLORIDE 08/30/2016 107  96 - 111 mmol/L Final   . CO2 TOTAL 08/30/2016 23  22 - 32 mmol/L Final   . ANION GAP 08/30/2016 12  4 - 13 mmol/L Final   . CALCIUM 08/30/2016 9.9  8.5 - 10.2 mg/dL Final   . GLUCOSE 08/30/2016 96  65 - 139 mg/dL Final   . BUN 08/30/2016 16  8 - 25 mg/dL Final   . CREATININE 08/30/2016 0.79  0.62 - 1.27 mg/dL Final   . BUN/CREA RATIO 08/30/2016 20  6 - 22 Final   . ESTIMATED GFR 08/30/2016 >59  >59 mL/min/1.27m^2 Final   . UNITS ORDERED 08/30/2016 NOT STATED       Final   . ABO/RH(D) 08/30/2016 O  POSITIVE   Final   . ANTIBODY SCREEN 08/30/2016 NEGATIVE   Final   . SPECIMEN EXPIRATION DATE 08/30/2016 09/02/2016   Final   Hospital Outpatient Visit on 08/30/2016   Component Date Value Ref Range Status   . Ventricular rate 08/30/2016 70  BPM Final   . Atrial Rate 08/30/2016 70  BPM Final   . PR Interval 08/30/2016 148  ms Final   . QRS Duration 08/30/2016 96  ms Final   . QT Interval 08/30/2016 382  ms Final   . QTC Calculation 08/30/2016 412  ms Final   . Calculated P Axis 08/30/2016 60  degrees Final   . Calculated R Axis 08/30/2016 37  degrees Final   . Calculated T Axis 08/30/2016 39  degrees Final   Preadmission on 10/01/2016   Component Date Value Ref Range Status   . GLUCOSE, POC 08/30/2016 92  70 - 105 mg/dL Final   Appointment on 06/01/2016   Component Date Value Ref Range Status   . SODIUM 06/01/2016 140  136 - 145 mmol/L Final   . POTASSIUM 06/01/2016 4.2  3.5 - 5.1 mmol/L Final   . CHLORIDE 06/01/2016 106  96 - 111 mmol/L Final   . CO2 TOTAL 06/01/2016 25  22 - 32 mmol/L Final   . ANION GAP 06/01/2016 9  4 - 13 mmol/L Final   . BUN 06/01/2016 14  8 - 25 mg/dL Final   . CREATININE 06/01/2016 0.72  0.62 - 1.27 mg/dL Final   . BUN/CREA RATIO 06/01/2016 19  6 - 22 Final   . ESTIMATED GFR 06/01/2016 >59  >59 mL/min/1.100m^2 Final   . ALBUMIN 06/01/2016 3.7  3.5 - 5.0 g/dL Final   . CALCIUM 06/01/2016 9.3  8.5 - 10.2 mg/dL Final   . GLUCOSE 06/01/2016 89  65 - 139 mg/dL Final   . ALKALINE PHOSPHATASE 06/01/2016 95  <150 U/L Final   . ALT (SGPT) 06/01/2016 50  <55 U/L Final   . AST (SGOT) 06/01/2016 39  8 - 48 U/L Final   . BILIRUBIN TOTAL 06/01/2016 0.5  0.3 - 1.3 mg/dL Final   . PROTEIN TOTAL 06/01/2016 7.6  6.4 - 8.3 g/dL Final   . ALDOLASE, SERUM 06/01/2016 12.0* <7.7 U/L Final       Test Performed by:  [facility]  [place], [place] [address]   . CREATINE KINASE 06/01/2016 775* 45 - 225 U/L Final   . ANTI-NUCLEAR ANTIBODIES,QUALITATIVE 06/01/2016  Negative  Negative Final      ANA non-reactive using an EIA-based screening method.   [place] ANTIBODIES,QUANTITATI* 06/01/2016 0.25  <=0.90 Index Value Final      Reference Range:    NEG  <=0.90        EQUIV  0.91-1.09      POS  >=1.10   . WBC 06/01/2016 5.2  3.5 - 11.0 x10^3/uL Final   . RBC 06/01/2016 4.93  4.06 - 5.63 x10^6/uL Final   . HGB 06/01/2016 15.4  12.5 - 16.3 g/dL Final   . HCT 06/01/2016 43.8  36.7 - 47.0 % Final   . MCV 06/01/2016 88.9  78.0 - 100.0 fL Final   . MCH 06/01/2016 31.2  27.4 - 33.0 pg Final   . MCHC 06/01/2016 35.1  32.5 - 35.8 g/dL Final   . RDW 06/01/2016 13.4  12.0 - 15.0 % Final   . PLATELETS 06/01/2016 217  140 - 450 x10^3/uL Final   . MPV 06/01/2016 9.1  7.5 - 11.5 fL Final   . NEUTROPHIL % 06/01/2016 60  % Final   . LYMPHOCYTE % 06/01/2016 24  % Final   . MONOCYTE % 06/01/2016 12  % Final   . EOSINOPHIL % 06/01/2016 3  % Final   . BASOPHIL % 06/01/2016 1  % Final   . NEUTROPHIL # 06/01/2016 3.12  1.50 - 7.70 x10^3/uL Final   . LYMPHOCYTE # 06/01/2016 1.25  1.00 - 4.80 x10^3/uL Final   . MONOCYTE # 06/01/2016 0.64  0.30 - 1.00 x10^3/uL Final   . EOSINOPHIL # 06/01/2016 0.15  0.00 - 0.50 x10^3/uL Final   . BASOPHIL # 06/01/2016 0.05  0.00 - 0.20 x10^3/uL Final   Reviewed EMG results and records from neurologist -     Assessment/Plan:  1. Myositis, unspecified myositis type, unspecified site    2. Long  term (current) use of systemic steroids    3. Malignant melanoma, unspecified site [facility])       Discussed case with Dr. Denton Ar - neurologist  If myositis - treatment complicated by diagnosis of melanoma - prednisone - 3-6 months - consider azathioprine or cellcept if it fails  Prednisone: discussed weight gain, decreased sleep, osteoporosis, psychosis, worsening sugars, increase in blood pressure. Patient willing to try medication.  He will monitor this with his PCP  Glaucoma and cataracts also risk - check in with eye doctor - he is agreeable to be on vitamin D while he takes  this  Scheduled for a muscle biopsy today   Scheduled for melanoma surgery on March 30 th - patient would prefer to wait till after surgery to start any sort of treatment especially since it makes infection easier  Requested records from Dr. Marinell Blight last visit and did not receive any  Check myomarker panel    Patient was advised to contact the office within two weeks of any testing (e.g. Labs, xrays or any other testing)  if they have not been contacted by our office with the results and recommendations.     Orders Placed This Encounter   . CANCELED: HELP LAB ORDER       Wandra Scot, MD 09/09/2016, 08:26    This note was partially generated using MModal Fluency Direct system, and there may be some incorrect words, spellings, and punctuation that were not noted in checking the note before saving

## 2016-09-09 NOTE — Procedures (Signed)
[place] SUMMARY  MUSCLE BIOPSY    PATIENT NAME: Victor Frazier NAME: [id number]    DATE OF SERVICE: 09/09/2016  DATE OF BIRTH:02-Apr-1981      Pre Procedure Diagnosis: Myopathy    Post Procedure Diagnosis: Same    Attending: Malachy Mood A. Tamala Julian, MD, PhD    Assistant:  None.     Anesthesia: Local     Incision:  Left Vastus Lateralis    Estimated Blood Loss: Less than 5cc    Complications: None    Procedure: Open Muscle Biopsy     Operative Procedure: Using sterile technique and 30 cc 1% Lidocaine for skin anesthesia, a 5 cm midline incision was made over the above noted muscle. Subcutaneous tissue was dissected and muscle fascia was identified. The muscle fascia was incised and a 0.3 x 0.3 cm piece of muscle was excised. After good hemostasis was observed, the muscle fascia was closed using 8 subcutaneous 4-0 vicryl sutures. The skin was closed using Dermabond. The dressing was applied and instruction for care given.     Findings of Procedure: Muscle was identified.    Specimen:  As noted in Operative Procedure.    Patient was observed in recovery and discharged home with written instructions under supervision in stable condition.    Leigh Aurora, MD, PhD  09/09/2016, 05:30

## 2016-09-09 NOTE — Discharge Instructions (Signed)
SURGICAL DISCHARGE INSTRUCTIONS     Dr. Smith, Cheryl, MD  performed your BIOPSY MUSCLE today at the [facility]    [facility]:  Monday through Friday from 6 a.m. - 7 p.m.: [phone]  Between 7 p.m. - 6 a.m., weekends and holidays:  Call [facility] at [phone] or [phone].    PLEASE SEE WRITTEN HANDOUTS AS DISCUSSED BY YOUR NURSE:      SIGNS AND SYMPTOMS OF A WOUND / INCISION INFECTION   Be sure to watch for the following:   Increase in redness or red streaks near or around the wound or incision.   Increase in pain that is intense or severe and cannot be relieved by the pain medication that your doctor has given you.   Increase in swelling that cannot be relieved by elevation of a body part, or by applying ice, if permitted.   Increase in drainage, or if yellow / green in color and smells bad. This could be on a dressing or a cast.   Increase in fever for longer than 24 hours, or an increase that is higher than 101 degrees Fahrenheit (normal body temperature is 98 degrees Fahrenheit). The incision may feel warm to the touch.    **CALL YOUR DOCTOR IF ONE OR MORE OF THESE SIGNS / SYMPTOMS SHOULD OCCUR.    ANESTHESIA INFORMATION   LOCAL ANESTHETIC:  You have receieved a local anesthetic, the effects should disappear in a few hours.    REMEMBER   If you experience any difficulty breathing, chest pain, bleeding that you feel is excessive, persistent nausea or vomiting or for any other concerns:  Call your physician Dr. Smith at [phone] or [phone]. You may also ask to have the  doctor on call paged. They are available to you 24 hours a day.    SPECIAL INSTRUCTIONS / COMMENTS       FOLLOW-UP APPOINTMENTS   Please call patient services at [phone] or [phone] to schedule a date / time of return. They are open Monday - Friday from 7:30 am - 5:00 pm.

## 2016-09-09 NOTE — H&P (Signed)
NAME:  Victor Frazier  DOB:  Jun 16, 1981  VISIT DATE:  09/09/2016    CC:  Left leg weakness, elevated CK (~775)    History obtained from the patient and chart/records  Age of patient:  36 y.o.    HPI:  Victor Frazier is a right handed 36 year old male with HTN, and orbital fracture due trauma presenting with left leg weakness and intermeittent calf tenderness ("feels like a knot") starting ~ 2015. Over time, the calf tenderness became persistent. He was evaluated by Orthopedics and an MRI was performed showing "swelling in the muscle."  He was referred to physical therapy and an outside rheumatologist ([place]).    Currently, he has difficulty ascending stairs. However, he ius able to exercise on the Elliptical for 3-4 miles. He denies falls or left leg trauma. He has intermittent muscle twitching (~1x/month) in the bilateral Lower extremities and/or biceps. He denies dysphagia, rash, and dry mouth. He has intermittent dry eyes.     His family history is unknown as he is adopted.     PMHx  Patient Active Problem List   Diagnosis   . Allergic rhinitis, cause unspecified   . Sinus pressure   . Facial pain   . Chronic left maxillary sinusitis   . Malignant melanoma of upper back [facility])     Past Surgical History:   Procedure Laterality Date   . EXCISION BENIGN SKIN LESION TRUNK / ARM / LEG      birthmark off left upper back   . HX LAP CHOLECYSTECTOMY     . HX TONSILLECTOMY     . ORBITAL RECONSTRUCTION Left      Family Medical History     Problem Relation (Age of Onset)    No Known Problems Other      Patient is adopted.     No current facility-administered medications for this encounter.      No Known Allergies  Social History     Social History   . Marital status: Single     Spouse name: N/A   . Number of children: N/A   . Years of education: N/A     Occupational History   . Not on file.     Social History Main Topics   . Smoking status: Former Smoker     Start date: 08/20/2011   . Smokeless tobacco: Former Systems developer       Comment: quit chewing 3 years ago   . Alcohol use Yes      Comment: a couple of shot os whiskey on the weekends   . Drug use: No   . Sexual activity: Not on file     Other Topics Concern   . Ability To Walk 1 Flight Of Steps Without Sob/Cp Yes   . Ability To Walk 2 Flight Of Steps Without Sob/Cp Yes   . Total Care No   . Ability To Do Own Adl's Yes   . Uses Walker No   . Uses Cane No     Social History Narrative     ROS  Constitutional - No fever, chills or weight changes.  HENT - Good dentition.  No hearing loss.  No swollen nodes.  Eyes - No diplopia  CV - No chest pain.  Resp - No dyspnea  GI - No difficulty swallowing.  No diarrhea or constipation  GU - No urinary incontinence  MS - No arthritis.    Skin - No rash  Psych - No  memory problems, confusion, anxiety, or depression.  No suicidal ideations.  Neuro - See HPI.  Endo - No diabetes or thyroid problems  Heme - No anemia.  No easy bruising or bleeding.    EXAMINATION    BP (!) 145/98  Pulse 94  Temp 37.1 C (98.8 F)  Resp 18  Ht 1.753 m (5\' 9" )  Wt 124 kg (273 lb 5.9 oz)  SpO2 96%  BMI 40.37 kg/m2    General/Appearance - Alert.  No acute distress.  Well kempt  HENT - Head atraumatic.  No corrective lenses.  No dentures  Fundi - Normal.  Discs are flat.  Cardiovascular - RRR.  Bilateral radial and pedal pulses 2+  Chest - Clear to auscultation.  Good cough.    Mental Status:  Orientation - Alert. Oriented x3  Attention - Normal  Memory - registration 3/3, recall 3/3, objects after 5 min.  Knowledge - normal  Language - normal  Speech - Normal.    Cranial Nerves:   2: No visual defect to confrontation.  PERRLA   3,4,6:  EOMI, no nystagmus   5:  Facial sensation intact to touch and pinprick in V1-V3.   7:  Face symmetric,  No facial weakness.   8: Hearing intact.  Finger rubbing heard bilaterally.   9,10:  Palate movement symmetric, uvula midline   11:  Sternocleidomastoid and trapezius muscles have full bulk and strength   12:  Tongue midline.  No  fasciculations.  Good strength. No atrophy.    Gait: - Normal.  Able to walk on toes, heels and in tandem    Coordination:  No tremor.  No ataxia or dysmetria on finger to nose or heel to shin.  RAM intact.  Romberg sign was negative.    Sensory - normal except as noted below   Light Touch   Pin   Vibration   Position    Tone:  Normal    Motor - Normal strength (5/5) throughout except as noted:  Upper Ext Right  Left   Deltoid     Biceps     Triceps     Wrist ext     Wrist Flex     Finger ext     Finger flex     FDI     APB     ADM          Lower Ext     Gluteii --- ---   Iliopsoas  4   Quadriceps  5-   Hamstrings  4   Ab/Adductors --- ---   Ant tibial  4+   Gastrocnemii     Post tibial (inversion)     Peronei (eversion)     Hallux dorsiflexion     Hallux plantarflexion       Neck Flex    Neck Ext    Left calf appears mildly smaller than the right        Reflexes - 2+ throughout and no Babinski or Hoffman signs unless noted below:   RJ BJ TJ KJ AJ Plantars Hoffman's   Right    3+      Left    3+        Myotonia - None  Fasciculations: Possible fasciculation over the RLE proximal lateral aspect on tensing the muscle, which disappears on relaxation. No tongue fasciculation.       Personal review of images,  Tracings, specimens, outside records:    NCS/EMG (08-04-2016, C Rayvion Stumph): This is  an abnormal study. Nerve conduction studies of the left upper and lower extrenity was normal. Needle examination of the left upper extrenity (i.e. Deltoid, biceps, and FDI) and thoracic paraspinsals was normal. Insertional activity was increased and fibrillations were observed in all lower extremity muscles tested (i.e. Vastus lateralis, Tibialis anterior, and medial gastrocnemius). An isolated fragmented myotonic discharge was observed in the medial gastronemius on needle insertion. MUAPS were large in the left vastus lateralis and tibialis anterior. A few polyphasic MUAPs were observed in the left medial gastorcnemius. These findings  are suggestive of a possible active myopathic process with neurogenic remodeling in the left lower extremity. Clinical correlation advised.     Labs:  08-30-16: BMP wnl  06-01-16: CBC diff, CMP wnl        ANA negative        CK 775, Aldolase 12  02-17-15: CK 696, Aldolase 8.2    Independent Interpretation:  None.   Assessment and Plan:    1. Left leg weakness, Elevated CK (600-700)    Muscle biopsy of left TA recommended.       Leigh Aurora, MD, PhD  09/09/2016, 05:02  Assistant Professor of Neurology  Neuromuscular Medicine

## 2016-09-20 ENCOUNTER — Encounter (INDEPENDENT_AMBULATORY_CARE_PROVIDER_SITE_OTHER): Payer: BC Managed Care – PPO | Admitting: Neurology

## 2016-09-20 ENCOUNTER — Other Ambulatory Visit (HOSPITAL_BASED_OUTPATIENT_CLINIC_OR_DEPARTMENT_OTHER): Payer: Self-pay | Admitting: SURGICAL ONCOLOGY

## 2016-09-20 NOTE — Progress Notes (Signed)
CUTANEOUS MALIGNANCIES  MULTIDISCIPLINARY TUMOR BOARD DISCUSSION:    PATIENT:   Victor Frazier NUMBER:   [id number]  DOB:    24-May-1981  AGE:   36 y.o.  DATE:   09/20/2016    REFERRING PROVIDER: Bonnetta Barry, MD  PCP: Jason Fila, MD    PRESENTER: Gus Height, MD  TYPE OF PRESENTATION: Prospective  PATHOLOGY REVIEWED AT [facility]?: Yes  RADIOGRAPHS REVIEWED AT [facility]?: No  NATIONAL GUIDELINES DISCUSSED?: Yes; NCCN    DIAGNOSIS: Malignant melanoma of right upper back  DIAGNOSIS LOCATION: [place]  DIAGNOSIS METHOD: Biopsy  STAGE: (clinical/pathologic) Stage IB pT2a cN0 cM0  RECURRENT?: No    MELANOMA:   HISTOLOGIC TYPE: Nevoid melanoma    BRESLOW DEPTH: 1.1 mm   ULCERATION: No   MITOTIC FIGURES: 1/mm2   MARGINS: Uninvolved. Melanoma in-situ comes to within <1 mm of a peripheral edge.   HIGH RISK FEATURES: TIL, non-brisk     FAMILY HISTORY?: Not Significant  GENETIC TESTING?: No  PROGNOSTIC INDICATORS DISCUSSED:  Yes    INTERVENTIONS:   SURGICAL/PROCEDURES: Shave Bx performed February 2018 by Dr. Marty Heck at [facility].      CLINICAL TRIAL PARTICIPATION: No  ELIGIBLE CLINICAL TRIALS: No     NEED FOR PALLIATIVE CARE?: No  PSYCHOSOCIAL CONCERNS?: No  REHABILITATION CONCERNS?: No  NUTRITIONAL CONCERNS?: No    PRELIMINARY RECOMMENDATIONS BASED ON TODAY'S TUMOR BOARD DISSCUSSION:         WLE w/ SLNB by Dr. Huel Cote          Plastic surgery reconstruction with Dr. Liliane Bade          If node negative, Melanoma Decision Dx genetic testing of the tumor to help predict personal risk of recurrence.          If identified as high risk by Melanoma Decision Dx genetic testing, imaging (PET/CT and MRI brain) is recommended.          Routine surgical surveillance of the primary tumor site and regional nodal basins          At least twice yearly full body dermatologic surveillance                Protection from the sun and UV radiation (proper sunscreen usage, UV protective clothing, lifestyle modification, etc)    Janelle Humphrey-Rowan 09/20/2016  09:04  Cutaneous Malignancies Tumor Board Coordinator    Illene Silver, MD 09/21/2016 11:16  Assistant Professor of Surgery  Division of [place]

## 2016-09-21 ENCOUNTER — Ambulatory Visit (HOSPITAL_BASED_OUTPATIENT_CLINIC_OR_DEPARTMENT_OTHER): Payer: Self-pay | Admitting: Plastic Surgery

## 2016-09-22 ENCOUNTER — Ambulatory Visit (INDEPENDENT_AMBULATORY_CARE_PROVIDER_SITE_OTHER): Payer: BC Managed Care – PPO | Admitting: Rheumatology

## 2016-09-22 ENCOUNTER — Ambulatory Visit: Payer: BC Managed Care – PPO | Attending: Neurology | Admitting: Neurology

## 2016-09-22 ENCOUNTER — Encounter (INDEPENDENT_AMBULATORY_CARE_PROVIDER_SITE_OTHER): Payer: Self-pay | Admitting: Neurology

## 2016-09-22 VITALS — BP 132/86 | HR 89 | Ht 69.0 in | Wt 276.0 lb

## 2016-09-22 DIAGNOSIS — R29898 Other symptoms and signs involving the musculoskeletal system: Secondary | ICD-10-CM

## 2016-09-22 DIAGNOSIS — Z6841 Body Mass Index (BMI) 40.0 and over, adult: Secondary | ICD-10-CM

## 2016-09-22 DIAGNOSIS — Z79899 Other long term (current) drug therapy: Secondary | ICD-10-CM | POA: Insufficient documentation

## 2016-09-22 LAB — CREATINE KINASE (CK), TOTAL, SERUM OR PLASMA: CREATINE KINASE: 605 U/L — ABNORMAL HIGH (ref 45–225)

## 2016-09-22 NOTE — Progress Notes (Deleted)
NAME:  Victor Frazier  DOB:  04-Apr-1981  VISIT DATE: 09/22/2016    CC:    Chief Complaint   Patient presents with   . Neurologic Problem     History obtained from the patient and chart/records, and review of last neurology note (this information was needed to manage patient today)  Age of patient:  36 y.o.      Progress note:  I had the pleasure of seeing your patient for a return visit today in the neuromuscular clinic, who is a 36 y.o. year old male with ***. He/She was last seen by me on *** and now returns for follow up.    2-2.5 years ago, he noted soreness in the left calf. He attributed that to being very physical active. The pain and discomfort stayed intermittent, about 6 months later, he noted that he trouble flexing his foot. Over the last year, he has noted some weakness in his left thigh, notes it is harder to go up a flight of stairs. He does not think he is progressively getting weaker. He gets muscle cramps every now or then, not on a consistent basis.  He does get some twitching in his thighs.   No weakness or cramping, no pain in the arms. Every now and then, he notices a muscle twitch in his right upper arm.   Swallowing and speech are normal. No shortness of breath.  He cannot think of any precipitating viral illness prior to the onset of all these symptoms.     Since last being seen,  ***    PMHx  Patient Active Problem List   Diagnosis   . Allergic rhinitis, cause unspecified   . Sinus pressure   . Facial pain   . Chronic left maxillary sinusitis   . Malignant melanoma of upper back [facility])     Current Outpatient Prescriptions   Medication Sig Dispense Refill   . metoprolol (LOPRESSOR) 25 mg Oral Tablet Take 25 mg by mouth Every evening        No current facility-administered medications for this visit.      No Known Allergies      ROS  Review of systems was positive for symptoms noted above.   All other systems have been reviewed and are negative except as per the HPI.    EXAMINATION  BP 132/86   Pulse 89  Ht 1.753 m (5\' 9" )  Wt 125.2 kg (276 lb 0.3 oz)  BMI 40.76 kg/m2    On general exam, patient was not in any acute distress, sitting comfortably in chair. HEENT was unremarkable, neck was supple, no lymphadenopathy.   Chest was clear to ausculation without any wheeze or rhonchi.  Heart sounds were normal S1 and S2.  Extremities were unremarkable without cyanosis or edema.    On neurological exam, patient was awake, alert and oriented to time, place and person.   Speech was fluent, with/without mild/moderate/severe dysarthria. No aphasia.  Cranial nerve exam revealed normal pupils. Fundus was benign without papilledema.   Extraocular movements were intact without diplopia or nystagmus. There was no ptosis at baseline or sustained upward gaze. Eye closure muscles did not show any weakness.   Face was symmetric without weakness. Patient was able to blow the cheeks well and push the tongue in the cheeks well.   Tongue was midline without atrophy or fasciculations.     Motor exam reveled normal muscle bulk throughout. There was no atrophy, fasciculations, hammertoes, high arches, shortening of achilles  tendons, scapular winging,  Or any other features of chronic neuromuscular disease.  Muscle strength was normal in neck flexor and extensor muscles.   In the upper extremities, strength was graded as follows (right/left): Deltoid ***/***, Biceps ***/***, Triceps ***/***, Wrist extensor ***/***, Wrist flexor ***/***, finger extensors ***/*** and interossei ***/***.  Strength in the lower extremities was as follows (right/left): Hip flexors ***/***, knee extensors ***/***, Knee flexors ***/***, ankle dorsiflexors ***/*** and plantar flexors ***/***.  Reflexes were graded as follows (right/left): Biceps***/***, triceps ***/***, Supinator ***/***, knee ***/***, ankles ***/***.  There was no hoffman's and no jaw jerk. There was no ankle clonus.    Sensory exam was normal to pin prick, temperature, vibration and  proprioception in both upper and lower extremities.     Sensory exam revealed decreased sensation to pin prick and temperature in a length dependent fashion up to mid/lower 1/3 of legs. Vibration was mildly reduced at toes. Proprioception was abnormal and patient was only able to appreciate larger excursions .    Gait was casual and normal.     Gait was unsteady, wide based.     Personal review of images, Tracings, specimens, outside records:  ***  Independent Interpretation:  ***  Assessment and Plan:  No diagnosis found.    Continue home meds except as noted above.  Total face-to-face time by staff:  *** minutes.  Total counseling/coordination of care time by staff:  *** minutes regarding:  Issues noted above plus    Casandra Doffing, MD  09/22/2016, 15:32  Associate Professor, [facility] Neurology  Neuromuscular Medicine

## 2016-09-22 NOTE — Progress Notes (Signed)
NAME:  Victor Frazier  DOB:  September 24, 1980  VISIT DATE: 09/22/2016    CC:    Chief Complaint   Patient presents with   . Neurologic Problem     History obtained from the patient and chart/records, and review of last neurology note (this information was needed to manage patient today)  Age of patient:  36 y.o.      Progress note:  I had the pleasure of seeing your patient for a return visit today in the neuromuscular clinic, who is a 36 y.o. year old male with left lower extremity weakness and high CK. He was last seen by me on 1/15 and now returns for follow up.    To summarize, his symptoms started 2-2.5 years ago, he noted soreness in the left calf. He attributed that to being very physical active. The pain and discomfort stayed intermittent, about 6 months later, he noted that he trouble plantar-flexing his foot. Over the last year, he has noted some weakness in his left thigh, notes it is harder to go up a flight of stairs. He does not think he is progressively getting weaker. He gets muscle cramps every now or then, not on a consistent basis.  He does get some twitching in his thighs.   No weakness or cramping, no pain in the arms. Every now and then, he notices a muscle twitch in his right upper arm.   Swallowing and speech are normal. No shortness of breath.  He cannot think of any precipitating viral illness prior to the onset of all these symptoms.   CK's have been 500-700 range. His NCS/EMG done by Dr. Tamala Julian showed fibrillation potentials and PSW's in all the tested left lower extremity muscles. MUAP's were of increased amplitude in TA and vastus medialis and a few polyphasic MUAP's were noted in gastrocnemius. Needle exam from left UE and thoracic paraspinal muscles were normal.   He had a muscle biopsy, it was preliminary read by Dr. 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 He is still sore from his biopsy site.    He did have a MRI L  spine in late 2016 which I personally reviewed as aprt of this evaluation, it showed minimal DDD at L5-S1, no significant N/f narrowing or foraminal stenosis.     PMHx  Patient Active Problem List   Diagnosis   . Allergic rhinitis, cause unspecified   . Sinus pressure   . Facial pain   . Chronic left maxillary sinusitis   . Malignant melanoma of upper back [facility])     Current Outpatient Prescriptions   Medication Sig Dispense Refill   . metoprolol (LOPRESSOR) 25 mg Oral Tablet Take 25 mg by mouth Every evening        No current facility-administered medications for this visit.      No Known Allergies      ROS  Review of systems was positive for symptoms noted above.   All other systems have been reviewed and are negative except as per the HPI.    EXAMINATION  BP 132/86  Pulse 89  Ht 1.753 m (5\' 9" )  Wt 125.2 kg (276 lb 0.3 oz)  BMI 40.76 kg/m2    On general exam, patient was not in any acute distress, sitting comfortably in chair.   HEENT was unremarkable, neck was supple, no lymphadenopathy.   Extremities were unremarkable without cyanosis or edema.  On neurological exam, patient was awake, alert and oriented to time, place and person.   Speech was fluent, without dysarthria. No aphasia.  Cranial nerve exam revealed normal pupils.    Extraocular movements were intact without diplopia or nystagmus. There was no ptosis at baseline or sustained upward gaze. Eye closure muscles did not show any weakness.   Face was symmetric without weakness. Patient was able to blow the cheeks well and push the tongue in the cheeks well.   Tongue was midline without atrophy or fasciculations.     Motor exam reveled decreased bulk in the left gastrocnemius muscle. I did appreciate fasciculations in the b/l quadriceps muscles which were not frequent. A single twitch was noted in the right bicep. Muscle strength was normal in neck flexor and extensor muscles and all muscle groups in both UE's.    Strength in the lower extremities was  as follows (right/left): Hip flexors 5/4+, knee extensors 5/5-, Knee flexors 5/4 (reports apin at the biopsy site), ankle dorsiflexors 5/5- and plantar flexors 5/4+, evertors 5/4+ and invertors 5/5.  Reflexes were graded as follows (right/left): Biceps 2+/2+, triceps 2+/2+, Supinator 3/3, knee 3+/3+, ankles 2/2.  There was no hoffman's and no jaw jerk. There was no ankle clonus.  Sensory exam was normal to pin prick, temperature, vibration and proprioception in both upper and lower extremities.     Assessment and Plan:    ICD-10-CM    1. Weakness of left lower extremity R29.898 Cpk (Ck)       Mr. 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 At this time, his symptoms are concerning for a motor neuron process, though he does not meet the conventional criteria for ALS, could meet clinically probable. I explained this to him and told him that we would like to clinically monitor him. I do not think we would consider immunotherapy such as steroids right now since he did not have much inflammation on his biopsy and that rather appeared more neurogenic. I will repeat his CK.  I will see him back in 2 months, and he would call me if he notes any further change.  Continue home meds except as noted above.    Casandra Doffing, MD  09/22/2016, 15:32  Associate Professor, [facility] Neurology  Neuromuscular Medicine

## 2016-09-23 ENCOUNTER — Ambulatory Visit
Admission: RE | Admit: 2016-09-23 | Discharge: 2016-09-23 | Disposition: A | Discharge status: Home / Self Care | Payer: BC Managed Care – PPO | Source: Ambulatory Visit

## 2016-09-23 ENCOUNTER — Encounter (HOSPITAL_BASED_OUTPATIENT_CLINIC_OR_DEPARTMENT_OTHER): Payer: Self-pay | Admitting: Rheumatology

## 2016-09-23 ENCOUNTER — Encounter (HOSPITAL_COMMUNITY): Payer: Self-pay

## 2016-09-27 LAB — MAYO MISC TEST - REFRIG

## 2016-09-27 NOTE — Progress Notes (Signed)
Dear Mr. Altland,     Your detailed testing for autoimmune disease or muscle inflammation were negative.  As discussed you will be having close follow-up with neurology.  Please let me know if I can be of further help you in the future.  For now neurology doesnot think that steroids or immunosuppression is indicated in your case.    No change in plan otherwise.   Please let me know if you have any other questions or concerns.   Follow up as discussed in clinic.     Thank you for allowing [place] to participate in your care.     Sincerely,   Mercie Eon MD  Rheumatology

## 2016-09-30 MED ORDER — DEXTROSE 5 % IN WATER (D5W) INTRAVENOUS SOLUTION
3.0000 g | Freq: Once | INTRAVENOUS | Status: AC
Start: 2016-09-30 — End: 2016-10-01
  Administered 2016-10-01: 3 g via INTRAVENOUS
  Filled 2016-09-30: qty 30

## 2016-09-30 MED ORDER — DEXTROSE 5 % IN WATER (D5W) INTRAVENOUS SOLUTION
3.0000 g | Freq: Once | INTRAVENOUS | Status: DC
Start: 2016-10-01 — End: 2016-10-01
  Filled 2016-09-30: qty 30

## 2016-10-01 ENCOUNTER — Ambulatory Visit (HOSPITAL_BASED_OUTPATIENT_CLINIC_OR_DEPARTMENT_OTHER): Payer: BC Managed Care – PPO

## 2016-10-01 ENCOUNTER — Inpatient Hospital Stay
Admission: RE | Admit: 2016-10-01 | Discharge: 2016-10-01 | Disposition: A | Discharge status: Home / Self Care | Payer: BC Managed Care – PPO | Source: Ambulatory Visit | Attending: SURGICAL ONCOLOGY | Admitting: SURGICAL ONCOLOGY

## 2016-10-01 ENCOUNTER — Ambulatory Visit (HOSPITAL_COMMUNITY): Payer: BC Managed Care – PPO

## 2016-10-01 ENCOUNTER — Encounter (HOSPITAL_COMMUNITY): Payer: Self-pay

## 2016-10-01 ENCOUNTER — Other Ambulatory Visit (HOSPITAL_COMMUNITY): Payer: Self-pay | Admitting: Radiology

## 2016-10-01 ENCOUNTER — Other Ambulatory Visit (HOSPITAL_COMMUNITY): Payer: Self-pay | Admitting: SURGICAL ONCOLOGY

## 2016-10-01 ENCOUNTER — Other Ambulatory Visit (HOSPITAL_COMMUNITY): Payer: Self-pay

## 2016-10-01 ENCOUNTER — Ambulatory Visit (HOSPITAL_COMMUNITY): Payer: BC Managed Care – PPO | Admitting: Family

## 2016-10-01 ENCOUNTER — Encounter (HOSPITAL_COMMUNITY)
Admission: RE | Disposition: A | Discharge status: Home / Self Care | Payer: Self-pay | Source: Ambulatory Visit | Attending: SURGICAL ONCOLOGY

## 2016-10-01 ENCOUNTER — Ambulatory Visit (HOSPITAL_BASED_OUTPATIENT_CLINIC_OR_DEPARTMENT_OTHER): Payer: BC Managed Care – PPO | Admitting: Certified Registered"

## 2016-10-01 ENCOUNTER — Ambulatory Visit (HOSPITAL_BASED_OUTPATIENT_CLINIC_OR_DEPARTMENT_OTHER): Payer: BC Managed Care – PPO | Admitting: SURGICAL ONCOLOGY

## 2016-10-01 DIAGNOSIS — Z872 Personal history of diseases of the skin and subcutaneous tissue: Secondary | ICD-10-CM

## 2016-10-01 DIAGNOSIS — Z9889 Other specified postprocedural states: Secondary | ICD-10-CM | POA: Insufficient documentation

## 2016-10-01 DIAGNOSIS — Z791 Long term (current) use of non-steroidal anti-inflammatories (NSAID): Secondary | ICD-10-CM | POA: Insufficient documentation

## 2016-10-01 DIAGNOSIS — Z79891 Long term (current) use of opiate analgesic: Secondary | ICD-10-CM | POA: Insufficient documentation

## 2016-10-01 DIAGNOSIS — R238 Other skin changes: Secondary | ICD-10-CM

## 2016-10-01 DIAGNOSIS — C4359 Malignant melanoma of other part of trunk: Secondary | ICD-10-CM

## 2016-10-01 DIAGNOSIS — Z87891 Personal history of nicotine dependence: Secondary | ICD-10-CM | POA: Insufficient documentation

## 2016-10-01 DIAGNOSIS — I10 Essential (primary) hypertension: Secondary | ICD-10-CM | POA: Insufficient documentation

## 2016-10-01 HISTORY — PX: SKIN CANCER EXCISION: SHX779

## 2016-10-01 SURGERY — EXCISION WIDE LOCAL MELANOMA WITH SENTINEL NODE MAPPING
Anesthesia: General | Site: Back | Laterality: Right | Wound class: Clean Wound: Uninfected operative wounds in which no inflammation occurred

## 2016-10-01 MED ORDER — FENTANYL (PF) 50 MCG/ML INJECTION SOLUTION
Freq: Once | INTRAMUSCULAR | Status: DC | PRN
Start: 2016-10-01 — End: 2016-10-01
  Administered 2016-10-01 (×5): 50 ug via INTRAVENOUS

## 2016-10-01 MED ORDER — LIDOCAINE 1 %-EPINEPHRINE 1:100,000 INJECTION SOLUTION
30.0000 mL | Freq: Once | INTRAMUSCULAR | Status: DC | PRN
Start: 2016-10-01 — End: 2016-10-01
  Administered 2016-10-01: 43 mL via INTRAMUSCULAR
  Administered 2016-10-01: 7 mL via INTRAMUSCULAR

## 2016-10-01 MED ORDER — BACITRACIN 500 UNIT/G OINTMENT TUBE
TOPICAL_OINTMENT | CUTANEOUS | Status: AC
Start: 2016-10-01 — End: 2016-10-01
  Filled 2016-10-01: qty 15

## 2016-10-01 MED ORDER — NEOSTIGMINE METHYLSULFATE 1 MG/ML INTRAVENOUS SOLUTION
Freq: Once | INTRAVENOUS | Status: DC | PRN
Start: 2016-10-01 — End: 2016-10-01
  Administered 2016-10-01: 3 mg via INTRAVENOUS

## 2016-10-01 MED ORDER — DEXAMETHASONE SODIUM PHOSPHATE 4 MG/ML INJECTION SOLUTION
Freq: Once | INTRAMUSCULAR | Status: DC | PRN
Start: 2016-10-01 — End: 2016-10-01

## 2016-10-01 MED ORDER — ONDANSETRON HCL (PF) 4 MG/2 ML INJECTION SOLUTION
Freq: Once | INTRAMUSCULAR | Status: DC | PRN
Start: 2016-10-01 — End: 2016-10-01
  Administered 2016-10-01: 4 mg via INTRAVENOUS

## 2016-10-01 MED ORDER — KETOROLAC 30 MG/ML (1 ML) INJECTION SOLUTION
Freq: Once | INTRAMUSCULAR | Status: DC | PRN
Start: 2016-10-01 — End: 2016-10-01
  Administered 2016-10-01: 30 mg via INTRAVENOUS

## 2016-10-01 MED ORDER — OXYCODONE-ACETAMINOPHEN 5 MG-325 MG TABLET: 1 | Tab | ORAL | 0 refills | 0 days | Status: DC | PRN

## 2016-10-01 MED ORDER — KETAMINE 10 MG/ML INJECTION SOLUTION
Freq: Once | INTRAMUSCULAR | Status: DC | PRN
Start: 2016-10-01 — End: 2016-10-01

## 2016-10-01 MED ORDER — WATER FOR IRRIGATION, STERILE SOLUTION
1000.0000 mL | Status: DC | PRN
Start: 2016-10-01 — End: 2016-10-01
  Administered 2016-10-01: 1000 mL

## 2016-10-01 MED ORDER — ROCURONIUM 10 MG/ML INTRAVENOUS SOLUTION
Freq: Once | INTRAVENOUS | Status: DC | PRN
Start: 2016-10-01 — End: 2016-10-01
  Administered 2016-10-01: 50 mg via INTRAVENOUS

## 2016-10-01 MED ORDER — ACETAMINOPHEN 1,000 MG/100 ML (10 MG/ML) INTRAVENOUS SOLUTION
Freq: Once | INTRAVENOUS | Status: DC | PRN
Start: 2016-10-01 — End: 2016-10-01
  Administered 2016-10-01: 1000 mg via INTRAVENOUS

## 2016-10-01 MED ORDER — LACTATED RINGERS INTRAVENOUS SOLUTION
INTRAVENOUS | Status: DC
Start: 2016-10-01 — End: 2016-10-01

## 2016-10-01 MED ORDER — SODIUM CHLORIDE 0.9 % (FLUSH) INJECTION SYRINGE
2.00 mL | INJECTION | Freq: Three times a day (TID) | INTRAMUSCULAR | Status: DC
Start: 2016-10-01 — End: 2016-10-01

## 2016-10-01 MED ORDER — SODIUM CHLORIDE 0.9 % (FLUSH) INJECTION SYRINGE
2.0000 mL | INJECTION | Freq: Three times a day (TID) | INTRAMUSCULAR | Status: DC
Start: 2016-10-01 — End: 2016-10-01

## 2016-10-01 MED ORDER — LIDOCAINE (PF) 100 MG/5 ML (2 %) INTRAVENOUS SYRINGE
INJECTION | Freq: Once | INTRAVENOUS | Status: DC | PRN
Start: 2016-10-01 — End: 2016-10-01
  Administered 2016-10-01 (×2): 100 mg via INTRAVENOUS

## 2016-10-01 MED ORDER — MIDAZOLAM 1 MG/ML INJECTION SOLUTION
Freq: Once | INTRAMUSCULAR | Status: DC | PRN
Start: 2016-10-01 — End: 2016-10-01
  Administered 2016-10-01: 2 mg via INTRAVENOUS

## 2016-10-01 MED ORDER — REMIFENTANIL 20 MCG/ML INFUSION - FOR ANES
INTRAVENOUS | Status: DC | PRN
Start: 2016-10-01 — End: 2016-10-01

## 2016-10-01 MED ORDER — ISOSULFAN BLUE 1 % SUBCUTANEOUS SOLUTION
5.0000 mL | Freq: Once | SUBCUTANEOUS | Status: DC | PRN
Start: 2016-10-01 — End: 2016-10-01
  Administered 2016-10-01: 4 mL via INTRAMUSCULAR
  Filled 2016-10-01: qty 5

## 2016-10-01 MED ORDER — SODIUM CHLORIDE 0.9 % (FLUSH) INJECTION SYRINGE
2.0000 mL | INJECTION | INTRAMUSCULAR | Status: DC | PRN
Start: 2016-10-01 — End: 2016-10-01

## 2016-10-01 MED ORDER — BUPIVACAINE (PF) 0.5 % (5 MG/ML) INJECTION SOLUTION
INTRAMUSCULAR | Status: AC
Start: 2016-10-01 — End: 2016-10-01
  Filled 2016-10-01: qty 30

## 2016-10-01 MED ORDER — LACTATED RINGERS INTRAVENOUS SOLUTION
INTRAVENOUS | Status: DC
Start: 2016-10-01 — End: 2016-10-01
  Administered 2016-10-01: 0 via INTRAVENOUS

## 2016-10-01 MED ORDER — GLYCOPYRROLATE 0.2 MG/ML INJECTION SOLUTION
Freq: Once | INTRAMUSCULAR | Status: DC | PRN
Start: 2016-10-01 — End: 2016-10-01
  Administered 2016-10-01: 0.4 mg via INTRAVENOUS

## 2016-10-01 MED ORDER — PROPOFOL 10 MG/ML INTRAVENOUS EMULSION
INTRAVENOUS | Status: DC | PRN
Start: 2016-10-01 — End: 2016-10-01

## 2016-10-01 MED ORDER — DEXAMETHASONE SODIUM PHOSPHATE 10 MG/ML INJECTION SOLUTION
Freq: Once | INTRAMUSCULAR | Status: DC | PRN
Start: 2016-10-01 — End: 2016-10-01
  Administered 2016-10-01 (×2): 4 mg via INTRAVENOUS

## 2016-10-01 MED ORDER — SODIUM CHLORIDE 0.9 % (FLUSH) INJECTION SYRINGE
2.00 mL | INJECTION | INTRAMUSCULAR | Status: DC | PRN
Start: 2016-10-01 — End: 2016-10-01

## 2016-10-01 MED ORDER — PROPOFOL 10 MG/ML IV BOLUS
INJECTION | Freq: Once | INTRAVENOUS | Status: DC | PRN
Start: 2016-10-01 — End: 2016-10-01
  Administered 2016-10-01: 200 mg via INTRAVENOUS

## 2016-10-01 MED ORDER — HEPARIN (PORCINE) 5,000 UNIT/ML INJECTION SOLUTION
5000.0000 [IU] | Freq: Once | INTRAMUSCULAR | Status: AC
Start: 2016-10-01 — End: 2016-10-01
  Administered 2016-10-01: 5000 [IU] via SUBCUTANEOUS
  Filled 2016-10-01: qty 1

## 2016-10-01 MED ADMIN — morphine 10 mg/5 mL oral solution: INTRAMUSCULAR | @ 13:00:00 | NDC 99910000119

## 2016-10-01 MED ADMIN — nystatin 100,000 unit/gram topical cream: INTRAVENOUS | @ 11:00:00 | NDC 51672128901

## 2016-10-01 SURGICAL SUPPLY — 121 items
ADH LIQUID LF  WTPRF VIAL PREP NONSTAIN MASTISOL STYRAX GUM MASTIC ALC MTHY SLCYT STRL CLR NHZR 2/3 (SEALANTS) ×3 IMPLANT
ADH LQ LF VIAL AMP PREP MASTI_SOL STYRAX GUM MASTIC ALC MTHY (SEALANTS) ×1
ADHESIVE TISSUE EXOFIN 1.0ML_PREMIERPRO EXOFIN (SEALANTS)
APPL 70% ISPRP 2% CHG 26ML 13._2X13.2IN CHLRPRP PREP DEHP-FR (WOUND CARE/ENTEROSTOMAL SUPPLY) ×4
APPL 70% ISPRP 2% CHG 26ML CHLRPRP HI-LT ORNG PREP STRL LF  DISP CLR (WOUND CARE SUPPLY) ×12 IMPLANT
BANDAGE HK-LOCK 5YDX4IN KNIT_ELAS UNQ LPLK DSGN WSHBL FRN (WOUND CARE/ENTEROSTOMAL SUPPLY) ×2
BANDAGE HOOK LOCK 5YDX4IN NONST 2 HOOK CLSR KNIT ELAS LPLK PLSTR COTTON COMPRESS LF  REUSE (WOUND CARE SUPPLY) ×6 IMPLANT
BANDAGE KERLIX 3 3/16FTX3 7/16 IN 6 PLY ABS WVN COT MED GAUZE (WOUND CARE SUPPLY) ×3 IMPLANT
BANDAGE KERLIX 3 3/16FTX3 7/16 IN 6 PLY ABS WVN COT MED GAUZE (WOUND CARE/ENTEROSTOMAL SUPPLY) ×1
BANDAGE KERLIX 4.1YDX4.5IN 6 PLY ABS CRNKL WV LINT FREE COTTON LRG GAUZE STRL LF  DISP (WOUND CARE SUPPLY) ×3 IMPLANT
BANDAGE KERLIX 4.1YDX4.5IN 6 P_LY ABS SFT PCH LINT FR COT LRG (WOUND CARE/ENTEROSTOMAL SUPPLY) ×1
BLADE DERMATOME 1.25-4.25IN STRL (SURGICAL CUTTING SUPPLIES) IMPLANT
BLADE DERMATOME 1.25-4.25IN ST_RL (CUTTING ELEMENTS)
BLADE DERMATOME SKIN GRAFT SYS STRL (SURGICAL CUTTING SUPPLIES) IMPLANT
BLADE DERMATOME ZIMMER AIR DER_MATOME II SKN GRAFT SYS STRL (CUTTING ELEMENTS)
BLADE TONGUE DEPRESSOR 6IN ST 100/BX 6023 ×8 IMPLANT
BLANKET 3M BAIR HUG ADLT UPR B ODY 74X24IN PLMR 2 INCS ADH (MISCELLANEOUS PT CARE ITEMS) ×4 IMPLANT
CARRIER GRAFT MSGRFT II DRMCR II 1.5:1 SKIN (ORTHOPEDICS (NOT IMPLANTS)) ×6 IMPLANT
CARRIER GRAFT MSGRFT II DRMCR_II 1.5:1 SKN (ORTHOPEDICS (NOT IMPLANTS)) ×2
CLEANER ESURG TIP LCTRBRS HND_SWCH PNCL STRL DISP (CLEN) ×1
CLEANER ESURG TIP LCTRBRS PNCL HNDSWH STRL DISP (CLEN) ×3 IMPLANT
CLIP LIGACLIP TI MED LT200_BX/36 (INSTRUMENTS ENDOMECHANICAL) ×1
CLIP LIGACLIP TI MED/LRG LT300_18EA/BX (INSTRUMENTS ENDOMECHANICAL) ×1
CLIP LIGACLIP TI SM LT100_BX/36 (INSTRUMENTS ENDOMECHANICAL) ×1
CLIP MED 3.2MM INTERNAL LIGACLIP X TI LGT OPN STRL DISP ENDOS LF  SILVER (ENDOSCOPIC SUPPLIES) ×3 IMPLANT
CLIP MED LRG INTERNAL LIGACLIP X TI LGT OPN STRL DISP ENDOS LF  GRN (ENDOSCOPIC SUPPLIES) ×3 IMPLANT
CLIP SM 2.6MM INTERNAL LIGACLIP X TI LGT OPN STRL DISP ENDOS LF  BLU (ENDOSCOPIC SUPPLIES) ×3 IMPLANT
CLOSURE SKIN STRIPS 1/2X4IN_R1547 6/PK 50PK/BX (WOUND CARE/ENTEROSTOMAL SUPPLY) ×1
CONTAINER SP CLIKSEAL 120CC_01063 STRL 300EA/CS (TUB) ×1
CONTAINR CLICKSEAL 4OZ TRANSLUC SCREW CAP STRL BLU SPECI PNEUM TUBE SYS (TUB) ×3 IMPLANT
CONV USE 338642 - PACK SURG PLASTIC STRL DISP LTX (CUSTOM TRAYS & PACK) ×3 IMPLANT
CONV USE 338643 - PACK SURG HEAD NK NONST DISP LF (CUSTOM TRAYS & PACK) ×3 IMPLANT
CONV USE ITEM 156524 - ADHESIVE TISSUE EXOFIN 1.0ML_PREMIERPRO EXOFIN (SEALANTS) IMPLANT
CONV USE ITEM 337890 - PACK SURG BSIN 2 STRL LF  DISP (CUSTOM TRAYS & PACK) ×3 IMPLANT
COVER 53X24IN MAYOSTAND PRXM STRL DISP EQP SMS LF (DRAPE/PACKS/SHEETS/OR TOWEL) ×6 IMPLANT
CULTURETTE SWAB NP WIRE ORNG_CAP 220132 (MICR) ×1
CUSHION POSITION PRONEVIEW INSITE LRG FOAM INSRT ADULT OR LF (SUPP) ×3 IMPLANT
CUSHION POSITION PRONEVIEW INS_ITE LRG FOAM INSRT ADLT OR LF (SUPP) ×1
DISC NO REPL - DRAPE NVG GPS GAM PROBE EQP (PROTECTIVE PRODUCTS/GARMENTS) ×3 IMPLANT
DISCONTINUED USE ITEM 102436 - NEEDLE HYPO 22GA 1.5IN REG WL_BD POLYPROP REG BVL LL HUB (NEEDLES & SYRINGE SUPPLIES) ×3 IMPLANT
DONUT EXTREMITY CUSHIONING 31143137 (POSITIONING PRODUCTS) ×4 IMPLANT
DRAPE ADH STRP LRG TWL 23X17IN_STRDRP LF STRL DISP SURG (PROTECTIVE PRODUCTS/GARMENTS) ×4
DRAPE MAYOSTAND CVR 53X24IN PR_XM LF STRL DISP EQP SMS (DRAPE/PACKS/SHEETS/OR TOWEL) ×2
DRAPE NVG GPS GAM PROBE EQP (PROTECTIVE PRODUCTS/GARMENTS) ×2
DRAPE TWL PLASTIC ADH 23X17IN LRG STRDRP STRL SURG TRNSPR (PROTECTIVE PRODUCTS/GARMENTS) ×12 IMPLANT
DRESS TRNSPR 10X4IN POLYUR ADH HYPOALL WTPRF TGDRM STRL LF (WOUND CARE SUPPLY) IMPLANT
DRESS TRNSPR 2.75INX2 3/8IN POLYUR ADH HYPOALL WTPRF TGDRM STD STRL LF (WOUND CARE SUPPLY) ×3 IMPLANT
DRESS TRNSPR 4.75X4IN ADH FILM WTPRF DIAMOND PTRN TGDRM STRL LF (WOUND CARE SUPPLY) ×3 IMPLANT
DRESSING TEGADERM 4 X 4_1686 TEGADERM 50/BX (WOUND CARE/ENTEROSTOMAL SUPPLY) ×1
DRESSING TRNSPR 2 3/8 X 2.75IN_1624W TEGADERM 100/BX (WOUND CARE/ENTEROSTOMAL SUPPLY) ×1
DRESSING TRNSPR FILM 4 X 10IN_1627 TEGADERM LF STRL 20/BX (WOUND CARE/ENTEROSTOMAL SUPPLY)
DRESSING XEROFORM 5 X 9IN 8884433605 50/BX 4BX/CS (WOUND CARE SUPPLY) ×6 IMPLANT
DRESSING XEROFORM 5 X 9IN 8884433605 50/BX 4BX/CS (WOUND CARE/ENTEROSTOMAL SUPPLY) ×2
ELECTRODE ESURG 360D BLADE PNC_L STD 10FT 3/32IN VLAB STRL (CAUTERY SUPPLIES) ×1
ELECTRODE ESURG 360D PNCL STD 10FT 3/32IN VLAB STRL DISP SMOKE EVAC ATTACH CORD HLSTR (CAUTERY SUPPLIES) ×3 IMPLANT
ELECTRODE ESURG BLADE 2.75IN 3/32IN EDGE STRL .2IN DISP INSL STD SHAFT HEX LOCK LF (CAUTERY SUPPLIES) ×9 IMPLANT
ELECTRODE ESURG BLADE 4IN 3/32IN EDGE STRL 1.1IN DISP STD SHAFT XTD LF (CAUTERY SUPPLIES) ×3 IMPLANT
ELECTRODE ESURG BLADE 4IN 3/32_IN EDGE STRL DISP XTD BEND LF (CAUTERY SUPPLIES) ×2
ELECTRODE ESURG BLADE PNCL 10FT VLAB STRL SS DISP BUTTON SWH HEX LOCK CORD HLSTR LF  ACPT 3/32IN STD (CAUTERY SUPPLIES) ×3 IMPLANT
ELECTRODE ESURG BLADE PNCL 10F_T VLAB STRL SS DISP BUTTON SWH (CAUTERY SUPPLIES) ×1
ELECTRODE ESURG BLADE STD 2.75_IN 3/32IN STRL EDGE .2IN DISP (CAUTERY SUPPLIES) ×3
GARMENT COMPRESS MED CALF CENTAURA NYL VASOGRAD LTWT BRTHBL SEQ FIL BLU 18- IN (ORTHOPEDICS (NOT IMPLANTS)) ×3 IMPLANT
GARMENT COMPRESS MED CALF CENT_AURA NYL VASOGRAD LTWT BRTHBL (ORTHOPEDICS (NOT IMPLANTS)) ×1
GAUZE SURG 4X4IN RFDETECT RF A_SSURE COTTON 16 PLY XRY DTBL (WOUND CARE/ENTEROSTOMAL SUPPLY) ×1
GAUZE SURG 4X4IN STD RFDETECT COTTON 16 PLY XRY ABS LF  STRL DISP (WOUND CARE SUPPLY) ×3 IMPLANT
GOWN SURG 2XL AAMI L3 REINF HK_LP CLSR SET IN SLEEVE STRL LF (PROTECTIVE PRODUCTS/GARMENTS)
GOWN SURG 2XL L3 REINF HKLP CLSR SET IN SLEEVE STRL LF  DISP BLU SIRUS SMS 49IN (PROTECTIVE PRODUCTS/GARMENTS) IMPLANT
GOWN SURG XL AAMI L3 NONREINFO_RCE HKLP CLSR STRL LTX PNK SMS (DGOW)
GOWN SURG XL L3 NONREINFORCE HKLP CLSR STRL LTX PNK SMS 47IN (DGOW) IMPLANT
HANDPC SUCT MEDIVAC YANKAUER BLBS TIP CLR STRL LF  DISP (Suction) ×3 IMPLANT
HANDPC SUCT MEDIVAC YANKAUER B_LBS TIP CLR STRL LF DISP (Suction) ×2
KIT PLS LAV PLSVC + FAN SPRAY STRL LF  DISP (IRR) ×3 IMPLANT
KIT PLS LAV PLSVC + FAN SPRAY_STRL LF DISP (IRR) ×1
KIT RM TURNOVER CLEANOP CSTM INFCT CONTROL (KITS & TRAYS (DISPOSABLE)) ×3
KIT RM TURNOVER CLEANOP CSTM I_NFCT CONTROL (KITS & TRAYS (DISPOSABLE)) ×1
KIT RM TURNOVER CLEANOP CUSTOM INFCT CONTROL (KITS & TRAYS (DISPOSABLE)) ×3 IMPLANT
KIT SETUP MINOR SURGICAL (KITS & TRAYS (DISPOSABLE)) ×4 IMPLANT
LABEL E-Z STICK_STLEZP1 100EA/CS (LABELS/CHART SUPPLIES) ×1
LABEL MED EZ PEEL MRKR LF (LABELS/CHART SUPPLIES) ×3 IMPLANT
NEEDLE HYPO 22GA 1.5IN REG WL_BD POLYPROP REG BVL LL HUB (NEEDLES & SYRINGE SUPPLIES) ×1
OIL MINERAL LIGHT 10ML BO_1865229 25EA/CS (WOUND CARE SUPPLY) IMPLANT
OIL MINERAL LIGHT 10ML BO_1865229 25EA/CS (WOUND CARE/ENTEROSTOMAL SUPPLY)
PACK BASIN DBL CUSTOM (CUSTOM TRAYS & PACK) ×1
PACK CUSTOM HEAD AND NECK (CUSTOM TRAYS & PACK) ×1
PACK CUSTOM PLASTIC_CS/1 (CUSTOM TRAYS & PACK) ×1
PACK EXTREMITY DYNJP8000_5EA/CS (CUSTOM TRAYS & PACK)
PACK SURG EXTREMITY I STRL LF (CUSTOM TRAYS & PACK) IMPLANT
PACK SURG PLASTIC STRL DISP LTX (CUSTOM TRAYS & PACK) ×1
PACK SURG UNIV SIRUS SPLT II REINF TBL CVR 2 ADH SD DRP STRL 90X50IN 79X35IN LF (CUSTOM TRAYS & PACK) ×3 IMPLANT
PACK UNIVERSAL_DYNJP1055S 7/CS (CUSTOM TRAYS & PACK) ×1
PAD ARMBOARD FOAM BLU_FP-ECARM (POSITIONING PRODUCTS) ×2
PAD ARMBRD BLU (POSITIONING PRODUCTS) ×6 IMPLANT
PAD TELFA 3X8 1238_BX/50/EA (WOUND CARE SUPPLY) IMPLANT
PAD TELFA 3X8 1238_BX/50/EA (WOUND CARE/ENTEROSTOMAL SUPPLY)
SOL IRRG 0.9% NACL 3L PLASTIC CONTAINR UROMATIC LF (SOLUTIONS) ×3 IMPLANT
SOLUTION IRRG NS 3000CC 2B7127_4/CS (SOLUTIONS) ×2
SPONGE GAUZE STRL 4 X 4IN TUB_6939 1280/CS (WOUND CARE SUPPLY) ×3 IMPLANT
SPONGE GAUZE STRL 4 X 4IN TUB_6939 1280/CS (WOUND CARE/ENTEROSTOMAL SUPPLY) ×1
SPONGE LAP 18X18 RFD STRL_L181804P01C1 40PK/CS (WOUND CARE/ENTEROSTOMAL SUPPLY) ×1
SPONGE LAP 18X18IN STD 4 PLY XRY RF DTBL ABS RFDETECT COTTON STRL LF  DISP (WOUND CARE SUPPLY) ×3 IMPLANT
STAPLER SKIN 4.1X6.5MM 35 W STPL CART LF  APS U DISP CLR SS PLASTIC (ENDOSCOPIC SUPPLIES) ×3 IMPLANT
STAPLER SKIN WIDE 35W_8886803712 12/BX (INSTRUMENTS ENDOMECHANICAL) ×1
STKNT ORTHO 48X12IN IMPRV SPD RL XL STRL (ORTHOPEDICS (NOT IMPLANTS)) IMPLANT
STKNT ORTHO 72X6IN COTTON ALBHL 1 PLY PCUT LF  TUB STRL NATURAL (ORTHOPEDICS (NOT IMPLANTS)) ×3 IMPLANT
STOCKINETTE IMPERV STRL XL_SYNTHETIC STRL 12X48IN 10RL/CS (ORTHOPEDICS (NOT IMPLANTS))
STOCKINETTE ORTHO 6X6IN PRECUT_COTTON STRL 7666 (ORTHOPEDICS (NOT IMPLANTS)) ×1
STRIP 4X.5IN STRSTRP PLSTR REINF SKNCLS WHT STRL LF (WOUND CARE SUPPLY) ×3 IMPLANT
SUTURE 2-0 FS ETHILON 18IN BLK MONOF NONAB (SUTURE/WOUND CLOSURE) ×4 IMPLANT
SWAB BD BBL BD CLTSWB LIQUID STRT MED AL RYN TMPR EVD SEAL 2 ACT CAP RND BTM TUBE REG WRE 5.25IN (MICR) ×3 IMPLANT
SYRINGE BD LL 10ML LF STRL CO_NTROL CONCEN TIP PRGN FREE (NEEDLES & SYRINGE SUPPLIES) ×1
SYRINGE LL 10ML LF  STRL CONTROL CONCEN TIP PRGN FREE DEHP-FR MED DISP (NEEDLES & SYRINGE SUPPLIES) ×3 IMPLANT
SYRINGE LL 10ML LF  STRL GRAD N-PYRG DEHP-FR PVC FREE MED DISP (NEEDLES & SYRINGE SUPPLIES) ×6 IMPLANT
SYRINGE LL 10ML LF STRL MED D_ISP (NEEDLES & SYRINGE SUPPLIES) ×2
SYRINGE LL 5ML LF STRL GRAD M_ED POLYPROP DISP CLR (NEEDLES & SYRINGE SUPPLIES) ×2
SYRINGE LL 5ML LF STRL GRAD N-PYRG DEHP-FR PVC FREE MED DISP CLR (NEEDLES & SYRINGE SUPPLIES) ×6 IMPLANT
TRAY SKIN SCRUB 8IN VNYL COTTON 6 WNG 6 SPONGE STICK 2 TIP APPL DRY STRL LF (KITS & TRAYS (DISPOSABLE)) IMPLANT
TRAY SURG PREP SCR CR ESTM (KITS & TRAYS (DISPOSABLE))
TUBE INDIV SPECI STRL (TUB) ×3 IMPLANT
TUBE SP INDIV STRL (TUB) ×1
WIPE BABY UNSCENTED FLUSHABLE MSC263820 960/CS (WOUND CARE SUPPLY) ×3 IMPLANT
WIPE BABY UNSCENTED FLUSHABLE MSC263820 960/CS (WOUND CARE/ENTEROSTOMAL SUPPLY) ×1

## 2016-10-01 NOTE — Brief Op Note (Signed)
[facility]  BRIEF OPERATIVE NOTE    PATIENT NAME:   Victor Frazier NUMBER:  [id number]  CONTACT NUMBER (CSN):  [id number]  DATE OF BIRTH:   01/07/1981  DATE OF SERVICE:  10/01/2016    PRE-OPERATIVE DIAGNOSES:   1. 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 Iatrogenic skin and subcutaneous tissue defect of the right upper back  PROCEDURE(S) PERFORMED:   1. Injection of lymphazurin blue dye  2. Right axillary lymphatic mapping  3. Right axillary sentinel lymph node biopsy  4. Wide local excision of right upper back melanoma with 2 cm margins    OPERATIVE FINDINGS: Preoperative nuclear scintigraphy demonstrated uptake in the right right axilla.  In the right axilla, one lymph node was identified that was hot and blue with a count of 28,477.  Background count of the right axilla post resection was 107.  Regarding the primary tumor site on the right upper back, there was a well healed shave biopsy site that measured 1.2 x 1.0 cm in greatest diameter.  Following resection with 2 cm margins, the resected specimen measured 4.6 x 4.5 cm and the resultant defect of the 7.5 measured 5.5 x 3.5 cm.  There was no gross evidence of deep invasion.  Hemostasis was excellent.  The case was then turned over to Dr. Liliane Bade with the plastic surgery team for reconstruction and wound closure.  OPERATIVE POSITION: Supine, then prone  COMPLICATIONS: None  WOUND CLASS: Clean Wound: Uninfected operative wounds in which no inflammation occurred    ATTENDING SURGEON: Crispin Vogel  ASSITANT(S): Daugherty    ANESTHESIA: General endotracheal anesthesia  ANTIBIOTICS: Ancef  ESTIMATED BLOOD LOSS (EBL): Minimal  INTRAVENOUS FLUIDS GIVEN (IVF): 1200 mL  BLOOD GIVEN: 0 units  URINE OUTPUT (UO): Not recorded (no foley)    TUBES: None  DRAINS: None  SPECIMENS / CULTURES: See path sheet  IMPLANTS: None            DISPOSITION: PACU - hemodynamically stable.  CONDITION: stable    Illene Silver, MD 10/01/2016 13:20  Assistant Professor of Surgery  Division of [place]

## 2016-10-01 NOTE — Care Plan (Signed)

## 2016-10-01 NOTE — Anesthesia Transfer of Care (Signed)
ANESTHESIA TRANSFER OF CARE   Victor Frazier is a 36 y.o. ,male, Weight: 122.4 kg (269 lb 13.5 oz)   had Procedure(s) with comments:  EXCISION WIDE LOCAL MELANOMA WITH SENTINEL NODE MAPPING - NucMed @ 10am (1st)  CLOSURE WOUND BACK/POSTERIOR TRUNK  performed  10/01/16   Primary Service: Illene Silver,*    Past Medical History:   Diagnosis Date   . Cancer ([place])     melanoma on his back   . Heartburn     diet controlled   . History of anesthesia complications     patient adopted   . HTN (hypertension)    . Malignant melanoma of upper back ([place])    . Wears glasses     reading      Allergy History as of 10/01/16      No Known Allergies          DOXYCYCLINE       Noted Status Severity Type Reaction    06/01/16 1120 [place], Exie Parody, [place] 01/30/15 Deleted  Intolerance  Other Adverse Reaction (Add comment)    Comments:  Severe headache       01/30/15 1615 Illene Regulus 01/30/15 Active  Intolerance  Other Adverse Reaction (Add comment)    Comments:  Severe headache                 I completed my transfer of care / handoff to the receiving personnel during which we discussed:                                                Additional Info:To RR.  Report to RN.  VSS                      Last OR Temp: Temperature: 36.5 C (97.7 F)  ABG:  POTASSIUM   Date Value Ref Range Status   08/30/2016 4.5 3.5 - 5.1 mmol/L Final     CALCIUM   Date Value Ref Range Status   08/30/2016 9.9 8.5 - 10.2 mg/dL Final     Calculated P Axis   Date Value Ref Range Status   08/30/2016 60 degrees Final     Calculated R Axis   Date Value Ref Range Status   08/30/2016 37 degrees Final     Calculated T Axis   Date Value Ref Range Status   08/30/2016 39 degrees Final     Airway:  EndoTracheal Tube (Active)     Blood pressure (!) 136/91, pulse 88, temperature 36.5 C (97.7 F), resp. rate 20, height 1.753 m (5\' 9" ), weight 122.4 kg (269 lb 13.5 oz), SpO2 100 %.

## 2016-10-01 NOTE — Anesthesia Postprocedure Evaluation (Signed)
Anesthesia Post Op Evaluation    Patient: Victor Frazier  Procedure(s) with comments:  EXCISION WIDE LOCAL MELANOMA WITH SENTINEL NODE MAPPING - NucMed @ 10am (1st)  CLOSURE WOUND BACK/POSTERIOR TRUNK    Last Vitals:Temperature: 36.6 C (97.9 F) (10/01/16 1600)  Heart Rate: 65 (10/01/16 1615)  BP (Non-Invasive): (!) 144/92 (10/01/16 1615)  Respiratory Rate: 16 (10/01/16 1615)  SpO2-1: 99 % (10/01/16 1615)  Pain Score (Numeric, Faces): 1 (10/01/16 1615)  Patient is sufficiently recovered from the effects of anesthesia to participate in the evaluation and has returned to their pre-procedure level.       Anesthetic complications: no

## 2016-10-01 NOTE — H&P (Signed)
[facility]  Surgery H&P    Victor Frazier, 36 y.o. male  Date of Birth:  January 14, 1981  Encounter Start Date:  10/01/2016  Inpatient Admission Date:      Information Obtained from: patient and history reviewed via medical record  Chief Complaint: malignant melanoma    PCP: Victor Fila, MD     HPI: (must include no less than 4 of the following main descriptors) Location (of pain): Quality (character of pain) Severity (minimal, mild, severe, scale or 1-10) Duration (how long has pain/sx present) Timing (when does pain/sx occur)  Context (activity at/before onset) Modifying Factors (what makes pain/sx  Better/worse) Associate Sign/Sx (what accompanies main pain/sx)     Victor Frazier is a 36 y.o. white male who presents to the hospital for wide local excitsion of malignant melanoma on the right upper back. He was first seen in clinic by Dr. Huel Cote on 08/30/16. He initially presented to a dermatologist with symptoms of pruritis and soreness of a new lesion of the right upper back. The lesion was biopsied, and pathology demostrated a 1.1 mm melanoma, nevoid subtype, without ulceration, and a mitotic rate of 1/mm2. For this reason, he was referred to the surgical oncology office for surgical evaluation. He has denied any systemic symptoms such as headaches, visual changes, abdominal pain, vomiting, fever, chills, night sweats, or weight loss.    Since last seen in the clinic, he states that he had a muscle bx performed for hx of lower extremity muscle weakness, but no other changes in medical condition.     Admission Source:  Home  Advance Directives:  None-Discussed  Hospice involvement prior to admission?  Not applicable    Pre-operative Risk Assessment   Baseline Dyspnea: None  Functional Health Status Prior to Surgery(Best in last 30 days): Independent - No assistance required for activities of daily living (May Use prosthetics or DME)  COPD: Patient does not have COPD  Ascites: No  CHF within 30  days: No  Hypertension requiring medications: Yes  Renal failure requiring dialysis: No  Widely metastatic cancer (Stage 4): No  Open wound: No  Weight loss > 10 % body weight in last 6 months: No  Bleeding disorder: No  Urinary Symptoms None  Current UTI Treatment:No  Pneumonia Symptoms:  none  Current Pneumonia Treatment:  no    ROS:  MUST comment on all "Abnormal" findings   ROS Other than ROS in the HPI, all other systems were negative.    PAST MEDICAL/ FAMILY/ SOCIAL HISTORY:       Past Medical History:   Diagnosis Date   . Cancer ([place])     melanoma on his back   . Heartburn     diet controlled   . History of anesthesia complications     patient adopted   . HTN (hypertension)    . Malignant melanoma of upper back ([place])    . Wears glasses     reading       No Known Allergies  Medications Prior to Admission     Prescriptions    Ibuprofen (MOTRIN) 200 mg Oral Tablet    Take 200 mg by mouth Four times a day as needed for Pain    metoprolol (LOPRESSOR) 25 mg Oral Tablet    Take 25 mg by mouth Every evening          Past Surgical History:   Procedure Laterality Date   . EXCISION BENIGN SKIN LESION TRUNK / ARM / LEG  birthmark off left upper back   . HX LAP CHOLECYSTECTOMY     . HX TONSILLECTOMY     . ORBITAL RECONSTRUCTION Left        Family History:   Family Medical History     Problem Relation (Age of Onset)    No Known Problems Other        Social History   Substance Use Topics   . Smoking status: Former Smoker     Start date: 08/20/2011   . Smokeless tobacco: Former Systems developer      Comment: quit chewing 3 years ago   . Alcohol use Yes      Comment: a couple of shot os whiskey on the weekends         PHYSICAL EXAMINATION: MUST comment on all "Abnormal" findings    Exam    Constitutional:  appears in good health, no distress and vital signs reviewed  Neck:  no thyromegaly or lymphadenopathy and supple, symmetrical, trachea midline  Respiratory:  Clear to auscultation bilaterally.   Cardiovascular:  regular rate and  rhythm  Gastrointestinal:  Soft, non-tender, Bowel sounds normal, non-distended  Musculoskeletal:  Head atraumatic and normocephalic  Integumentary:  Skin warm and dry, No rashes and 1cm x 0.5 cm area of scar over the right scapula where shave bx was performed  Neurologic:  Grossly normal  Lymphatic/Immunologic/Hematologic:  No lymphadenopathy  Psychiatric:  Normal    Labs Ordered/ Reviewed (Please indicate ordered or reviewed)   Reviewed: Labs:  I have reviewed all lab results.  Lab Results Today:  No results found for any visits on 10/01/16 (from the past 24 hour(s)).    Pathology reviewed   SKIN, RIGHT BACK, SHAVE BIOPSY on 08/16/16      - Malignant melanoma (see Cancer Case Summary).        CANCER CASE SUMMARY  Procedure: Biopsy, shave.  Specimen laterality: Right.  Tumor site: Back.  Microscopic satellite nodules: None identified.  Histologic type: Nevoid melanoma.  Maximum tumor thickness: 1.1 mm.  Ulceration: Not identified.  Microsatellites: Not identified.  Margins: Uninvolved.    Melanoma in-situ comes to within <1 mm of a peripheral edge.  Mitotic rate: 1/mm2.  Lymphovascular invasion: Not identified.  Neurotrophism: Not identified.  Tumor infiltrating lymphocytes: Present, non-brisk.  Tumor regression: Not identified.  Pathologic stage: pT2a, Nx.    ASSESSMENT & PLAN:      Active Hospital Problems   (*Primary Problem)    Diagnosis   . Malignant melanoma of upper back ([place])     Pt is a 36 year old male who presents to the hospital for wide local excision of right upper back malignant melanoma followed by closure by plastic surgery. He feels well overall and is clear to undergo surgery as planned.    DNR Status this admission:  Full Code  Palliative Care consulted?  no    Current Comorbid Conditions - General Surgery H&P    Respiratory Failure/Other-Not applicable  Not applicable  Arrhythmia Not applicable  Hepatitis-Not applicable  Coagulopathy Not applicable    DVT/PE  Prophylaxis: Hold for OR in AM    Willa Rough, DO PGY-1 10/01/16 11:16   [facility] Department of Surgery    I saw and examined this patient with the resident above.  Please see the resident's note, which I have carefully reviewed, for full details. I agree with the findings and plan of care as documented in the resident's note.  Any additions/exceptions are edited/noted.  Illene Silver, MD 10/01/2016 11:36  Assistant Professor of Surgery  Division of [place]

## 2016-10-01 NOTE — Discharge Instructions (Signed)
SURGICAL DISCHARGE INSTRUCTIONS     Dr. Thomay, Alan Adolph, MD  performed your EXCISION WIDE LOCAL MELANOMA WITH SENTINEL NODE MAPPING today at the [facility]    [facility]:  Monday through Friday from 6 a.m. - 7 p.m.: [phone]  Between 7 p.m. - 6 a.m., weekends and holidays:  Call [facility] at [phone] or [phone].    PLEASE SEE WRITTEN HANDOUTS AS DISCUSSED BY YOUR NURSE    SIGNS AND SYMPTOMS OF A WOUND / INCISION INFECTION   Be sure to watch for the following:   Increase in redness or red streaks near or around the wound or incision.   Increase in pain that is intense or severe and cannot be relieved by the pain medication that your doctor has given you.   Increase in swelling that cannot be relieved by elevation of a body part, or by applying ice, if permitted.   Increase in drainage, or if yellow / green in color and smells bad. This could be on a dressing or a cast.   Increase in fever for longer than 24 hours, or an increase that is higher than 101 degrees Fahrenheit (normal body temperature is 98 degrees Fahrenheit). The incision may feel warm to the touch.    **CALL YOUR DOCTOR IF ONE OR MORE OF THESE SIGNS / SYMPTOMS SHOULD OCCUR.    ANESTHESIA INFORMATION   ANESTHESIA -- ADULT PATIENTS:  You have received intravenous sedation / general anesthesia, and you may feel drowsy and light-headed for several hours. You may even experience some forgetfulness of the procedure. DO NOT DRIVE A MOTOR VEHICLE or perform any activity requiring complete alertness or coordination until you feel fully awake in about 24-48 hours. Do not drink alcoholic beverages for at least 24 hours. Do not stay alone, you must have a responsible adult available to be with you. You may also experience a dry mouth or nausea for 24 hours. This is a normal side effect and will disappear as the effects of the medication wear off.    REMEMBER   If you experience any difficulty breathing, chest  pain, bleeding that you feel is excessive, persistent nausea or vomiting or for any other concerns:  Call your physician Dr. Thomay at [phone] or [phone]. You may also ask to have the Surgical Oncology doctor on call paged. They are available to you 24 hours a day.    SPECIAL INSTRUCTIONS / COMMENTS   Please review handouts and call with any questions or concerns.      FOLLOW-UP APPOINTMENTS   Please call patient services at [phone] or [phone] to schedule a date / time of return. They are open Monday - Friday from 7:30 am - 5:00 pm.

## 2016-10-01 NOTE — Care Plan (Signed)
Problem: Anxiety (Adult)  Goal: Identify Related Risk Factors and Signs and Symptoms  Related risk factors and signs and symptoms are identified upon initiation of Human Response Clinical Practice Guideline (CPG).   Outcome: Ongoing (see interventions/notes)

## 2016-10-01 NOTE — OR PostOp (Signed)
Pt left unit via w/c escorted by staff with all belongings after all discharge criteria was met.  All D/C instructions given along with any prescriptions.  Patient and family verbalized understanding.  All VSS.

## 2016-10-01 NOTE — Addendum Note (Signed)
Addendum  created 10/01/16 1721 by Manus Gunning, CRNA    Anesthesia Intra Meds edited

## 2016-10-01 NOTE — Discharge Summary (Signed)
[facility]  DISCHARGE SUMMARY      PATIENT NAME:  Victor Frazier, Victor Frazier  MRN:  [id number]  DOB:  04-Nov-1980    ENCOUNTER DATE:  10/01/2016  INPATIENT ADMISSION DATE:   DISCHARGE DATE:  10/01/2016    ATTENDING PHYSICIAN: Illene Silver, MD  SERVICE: SURG ONCOLOGY  PRIMARY CARE PHYSICIAN: Jason Fila, MD         DISCHARGE DIAGNOSIS:     Principal Problem:  Malignant melanoma    Active Hospital Problems    Diagnosis Date Noted    Malignant melanoma of upper back [facility])       Resolved Hospital Problems    Diagnosis    No resolved problems to display.     Active Non-Hospital Problems    Diagnosis Date Noted    Allergic rhinitis, cause unspecified 01/16/2014    Sinus pressure 01/16/2014    Facial pain 01/16/2014    Chronic left maxillary sinusitis 01/16/2014      No Known Allergies         DISCHARGE MEDICATIONS:     Current Discharge Medication List      START taking these medications.       Details    oxyCODONE-acetaminophen 5-325 mg Tablet   Commonly known as:  PERCOCET    1 Tab, Oral, Q4H PRN   Qty:  15 Tab   Refills:  0         CONTINUE these medications - NO CHANGES were made during your visit.       Details    Ibuprofen 200 mg Tablet   Commonly known as:  MOTRIN    200 mg, Oral, 4x/day PRN   Refills:  0       metoprolol 25 mg Tablet   Commonly known as:  LOPRESSOR    25 mg, Oral, QPM   Refills:  0           Discharge med list refreshed?  YES        DISCHARGE INSTRUCTIONS:  Follow-up Information     Follow up with [facility] .    Specialty:  Oncology    Contact information:    [address]  [place] [address]  [phone]    Additional information:    For driving directions to the [facility] in [place], please call [phone]). You may also visit our website at [email or web address]*Valet parking is available to patients at [facility] outpatient clinics for free and tipping is not required.*Visitors to  our main campus will Location manager as we are expanding to better serve you. We apologize for any inconvenience this may cause and appreciate your patience.          DISCHARGE INSTRUCTION - RESUME HOME DIET   Diet: RESUME HOME DIET      DISCHARGE INSTRUCTION - ACTIVITY - NO DRIVING WHILE TAKING NARCOTIC PAIN MEDICATION   Activity: NO DRIVING WHILE TAKING NARCOTICS      DISCHARGE INSTRUCTION - ACTIVITY - POST SENTINAL LYMPH NODE OR AXILLARY DISSECTION   No lifting greater than 10 pounds and no pulling or pushing with the affected arm or leg for 1 week.   Activity: z - other (specify in comments) No Lifting Greater Than 10 Pounds, No Pulling/Pushing With Affected Extremity     DISCHARGE INSTRUCTION - INCISION/WOUND CARE   POST-OPERATIVE WOUND CARE  From the day of placement, the stiches/staples on the surgical wound will be removed in 10-14 days. You  will have an appointment with your surgeon for the removal.   [place] the wounds at least once a day with warm, soapy water. You may do this easily in the shower. No swimming or submerging the wound, like in a pool, bathtub or hot tub.  Pat the wound dry. DO NOT RUB!  Please refer to your diet and activity instructions. You are on these restrictions until your clinic appointment(s) and evaluation of your healing.  SEEK MEDICAL ATTENTION IF:  There is redness, swelling or increased pain in the wound that is not controlled with pain medications as prescribed.  There is drainage, blood or pus coming from the wound lasting longer than 1 day, or sooner if there is a concern.  You develop signs of a generalized infection including muscle aches, chills, fever or a general ill feeling.  You notice a foul smell coming from the wound or dressing.  You developed persistent nausea or vomiting.   Instructions for incision/wound care: z - other (specify in comments)    Instructions for incision/wound care: Do NOT Remove Steri Strips, They will fall off on thier own     Instructions for incision/wound care: Keep Incision Clean and Dry    Instructions for incision/wound care: May shower tomorrow    Instructions for incision/wound care: May Shower, NO Tub Baths or Research scientist (medical) for incision/wound care: NO Tub Baths / Pools for 1 week      DISCHARGE INSTRUCTION - SIGNS AND SYMPTOMS OF INFECTION   SIGNS AND SYMPTOMS OF INFECTION    Please watch your incision/wound site for the following signs of potential infection:    Increased redness or warmth at the incision site.  Drainage from the wound that may be foul smelling, cloudy, yellow or green in color.  Bulging or increased swelling at the incision site.  A temperature of more than 101.5 degrees F by mouth for 2 readings taken 4 hours apart.  A sudden increase in pain at the wound that is not relieved with pain medication.     DISCHARGE INSTRUCTION - POST-SURGICAL PAIN   Post surgical pain is a complex response to tissue trauma during surgery that stimulates hypersensitivity of the nervous system. Post-operative pain can be felt after any surgical procedure. Post-operative pain increases the possibility of post-surgical complications, raises the cost of medical care, and most importantly, interferes with recovery and return to normal activities of daily living. Management of post-surgical pain is a basic patient right.    Recent studies on pain control are indicating that taking a non-steroidal anti-inflammatory drug such as ibuprofen (Advil, Motrin) together with acetaminophen (Tylenol) has more significant post-operative pain relief than taking either drug alone.  Also, the ibuprofen and acetaminophen combination has significantly more pain relief than narcotic medications such as codeine, hydrocodone (Vicodin, Norco, Lortab), and oxycodone (Percocet, Percodan).    For mild to moderate discomfort following surgery, 400 mg of ibuprofen (2 tablets of Advil or Motrin) every 4 hours as needed or 600 mg of ibuprofen  (3 tablets of Advil or Motrin) every 6 hours as needed is usually adequate.  If you are unable to take ibuprofen, then 500 - 1000 mg of acetaminophen (1 or 2 tablets of Extra Strength Tylenol) every 6 hours as needed can be taken for mild to moderate discomfort.  If you are experiencing moderate to severe pain, we suggest that you take 600 mg of ibuprofen (3 tablets of Advil or Motrin) and 1000 mg of  acetaminophen (2 tablets of Extra Strength Tylenol) at the same time every 6 hours as needed.  If this does not give you adequate pain relief, please contact us for advice.    Do Not Take Tylenol or acetaminophen if you are already taking a narcotic drug that contains acetaminophen     SCHEDULE FOLLOW-UP SURG SPEC - SURGICAL ONCOLOGY - PHYSICIAN OFFICE CENTER   Follow-up in: OTHER: (specify in comments) 1 -2 Weeks   Other, Please Specify: 2 weeks    Reason for visit: POST-OP VISIT    Follow-up reason: wide local excision for melanoma, sentinel node bx    Coordination with other outpt. appts.? YES (provide details in comment section & place separate orders for those items)    Provider: Bethann Berkshire (for Dr. Huel Cote)           [place]:  This is a 36 y.o., male with malignant melanoma who presents to the hospital for wide local excision and sentinel lymph node bx. He underwent the procedure without complication and will be discharged at this time with instructions to follow up in the surgical oncology and plastic surgery clinics.      CONDITION ON DISCHARGE:  A. Ambulation: Full ambulation  B. Self-care Ability: With partial assistance  C. Cognitive Status Alert and Oriented x 3  D. DNR status at discharge: Full Code    Advance Directive Information       Most Recent Value    Does the Patient have an Advance Directive? No, Information Offered and Refused          DISCHARGE DISPOSITION:  Home discharge      Willa Rough, DO PGY-1 10/01/16 15:14   [facility] Department of  Surgery      Copies sent to Care Team       Relationship Specialty Notifications Start End    Jason Fila, MD PCP - General EXTERNAL  06/11/14     Phone: [phone] Fax: [place] [facility] [place] [address]    Illene Silver, MD Surgeon SURGICAL ONCOLOGY  08/30/16     Phone: [phone] Fax: [phone]         [address] [place] [address]    Bonnetta Barry, MD Specialist Physician DERMATOLOGY  08/30/16     Phone: [phone] Fax: [phone]         [address]  [place] [address]    Blanchie Dessert, MD Surgeon PLASTIC AND RECONSTRUCTIVE SURGERY  08/30/16     Phone: [phone] Fax: [phone]         [address] [facility] [address]          Referring providers can utilize [email or web address] to access their referred [facility] patient's information.    Illene Silver, MD 10/01/2016 18:44  Assistant Professor of Surgery  Division of [place]

## 2016-10-01 NOTE — Brief Op Note (Signed)
[facility]                                                     BRIEF OPERATIVE NOTE    Patient Name: [facility] Number: [id number]  Date of Service: 10/01/2016   Date of Birth: [birth year]    All elements must be documented.    Pre-Operative Diagnosis: Back melanoma - stage IB   Post-Operative Diagnosis: Same  Procedure(s)/Description:  Intermediate closure of back melanoma 7.5 x 5.5 x3.5cm  Findings/Complexity (inherent to the procedure performed): none     Attending Surgeon: Glean Salen  Assistant(s): Beaty, C; Daugherty,L    Anesthesia Type: General  Estimated Blood Loss:  less than 50 ml  Blood Given: none  Fluids Given: 0174 ml   Complications (not routinely expected or not inherent to difficulty/nature of procedure):none  Characteristic Event (routinely expected or inherent to the difficulty/nature of the procedure): none  Did the use of current and/or prior Anticoagulants impact the outcome of the case? no  Wound Class: Clean Wound: Uninfected operative wounds in which no inflammation occurred    Tubes: None  Drains: None  Specimens/ Cultures: as per Dr Huel Cote  Implants: None           Disposition: PACU - hemodynamically stable.  Condition: stable    Blanchie Dessert, MD

## 2016-10-01 NOTE — Care Plan (Signed)
Problem: Thought Process Alteration (Adult)  Goal: Identify Related Risk Factors and Signs and Symptoms  Related risk factors and signs and symptoms are identified upon initiation of [facility] Clinical Practice Guideline (CPG).   Outcome: Ongoing (see interventions/notes)

## 2016-10-03 NOTE — OR Surgeon (Signed)
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    POSTOPERATIVE DIAGNOSES:  1. Stage IB, pT2a cN0 cM0, malignant melanoma of the right upper back.  2. Iatrogenic skin and subcutaneous tissue defect of the right upper back.    NAME OF PROCEDURES:  1. Injection of Lymphazurin blue dye.  2. Right axillary lymphatic mapping.  3. Right axillary sentinel lymph node biopsy.  4. Wide local excision of right upper back melanoma with 2 cm margins.    SURGEON:  Gus Height, MD.    ASSISTANT:  Willa Rough, DO (PGY 1).    ESTIMATED BLOOD LOSS:  Minimal.    INTRAVENOUS FLUIDS:  1200 mL.    URINE OUTPUT:  Not recorded (no Foley).    INDICATIONS FOR PROCEDURE:  Victor Frazier is a 36 year old male who initially presented with a changing lesion of the right upper back.  This was biopsied and found to be a melanoma and he presents today for definitive surgical resection.  The risks and benefits of the operation were discussed with the patient in detail including but not limited to permanent skin tattooing, green urine, bleeding, infection, hematoma, seroma, reaction to the Lymphazurin, nerve injury and a 6-12% risk of chronic lymphedema along with anesthetic complications such as acute MI, stroke and intraoperative death.  Following deliberation, the patient agreed to proceed as recommended and signed informed consent.    OPERATIVE FINDINGS:  Preoperative nuclear scintigraphy demonstrated uptake in the right right axilla.  In the right axilla, one lymph node was identified that was hot and blue with a count of 28,477.  Background count of the right axilla post resection was 107.  Regarding the primary tumor site on the right upper back, there was a well healed shave biopsy site that measured 1.2 x 1.0 cm in greatest diameter.  Following resection  with 2 cm margins, the resected specimen measured 4.6 x 4.5 cm and the resultant defect of the 7.5 measured 5.5 x 3.5 cm.  There was no gross evidence of deep invasion.  Hemostasis was excellent.  The case was then turned over to Dr. Liliane Bade with the plastic surgery team for reconstruction and wound closure..    DESCRIPTION OF PROCEDURE:  Once the procedure had been explained and informed consent was obtained, the patient was transferred to the operating room and placed supine on the operating room table.  After completion of the proper patient verification and timeout protocols, the patient was placed under general endotracheal anesthesia by the anesthetic team.  Five milliliters of Lymphazurin blue dye were then injected into the skin surrounding the patient's known melanoma of the right upper back.  This was massaged for 5 minutes to facilitate travel via the lymphatic system.  The patient's right axilla was then shaved, prepped and draped in the standard surgical fashion.     A mixture of 1% lidocaine with epinephrine and 0.5% bupivacaine was used for local anesthesia of all skin incisions.  A 3-4 cm elliptical incision at the base of the hair-bearing area of the right axilla was created with a scalpel.  This was carried down through the subcutaneous tissue utilizing electrocautery and the axillary fascia was incised.  Utilizing the nuclear probe, a hot lymph node was identified behind the pectoralis major muscle that also had the  characteristic blue/green appearance from the Lymphazurin dye.  This was carefully dissected free of surrounding structures utilizing both blunt and sharp dissection and all obvious lymphatics or vascular structures were either clipped or cauterized.  Once fully excised, a final nuclear count was obtained 28,477 and the node was passed off the table as a permanent pathologic specimen.  Upon placement of the nuclear probe back within the axilla, no further areas of hot activity greater  than 10% of the hottest node were identified.  The background count of the axilla was 107 post resection.  The axillary wound was copiously irrigated with warm normal saline until the effluent was clear.  Any evidence of bleeding was controlled utilizing electrocautery.  The axillary fascia was reapproximated utilizing interrupted 3-0 Vicryl sutures.  The axillary incision was closed in 2 layers utilizing interrupted 3-0 Vicryl stitches for the deep dermis followed by a 4-0 Monocryl running subcuticular stitch for the skin.  The skin of the right axilla was then washed and dried and Mastisol and Steri-Strips were applied to the incision site followed by a dry sterile dressing.     The patient was then repositioned into the prone position with great care to pad all pressure areas and protect the patient's genitalia.  The right upper back was then shaved, prepped and draped in the standard surgical fashion.  A well-healed shave biopsy site was noted in this location measuring 1.2 x 1.0 cm in greatest diameter.  Two centimeter margins were drawn all directions and the skin was anesthetized utilizing a combination of 1% lidocaine with epinephrine and 0.5% bupivacaine.  An incision was created in the premarked margins and carried down through the subcutaneous tissue all the way down to the muscular fascia of the right upper back.  The specimen was then excised off the muscular fascia of the right upper back utilizing electrocautery.  For orientation purposes, a short stitch was placed in the superior location and a long stitch in the right lateral location and the specimen was passed off the table for permanent pathologic sectioning.  Following resection with 2 cm margins, the resected specimen measured 4.6 x 4.5 cm and the resultant defect of the right upper back measured 7.5 x 5.5 x 3.5 cm in greatest diameter.  Hemostasis was achieved of any small bleeding sites utilizing electrocautery.  The wound was then copiously  irrigated with normal saline until the effluent was clear.  The case was then turned over to Dr. Liliane Bade with the Plastic surgery team for wound closure and reconstruction.     During my portion of the procedures, the patient tolerated the procedure well with no immediate complications and remained intubated and prone on the operative table for reconstruction by the plastic surgical team.  The instrument, sponge and equipment count was correct x2.  I was present and scrubbed throughout the entirety of the above dictated portions of the procedure.    Illene Silver, MD  Assistant Professor   [place] Department of Surgery    DD:  10/01/2016 19:16:21  DT:  10/03/2016 11:09:55 CB  D#:  [id number]

## 2016-10-03 NOTE — OR Surgeon (Signed)
PATIENT NAME: Victor Frazier, Victor Frazier NUMBER:  [id number]  DATE OF SERVICE: 10/01/2016  DATE OF BIRTH:  10-Mar-1981    OPERATIVE REPORT    PREOPERATIVE DIAGNOSIS:  Back melanoma, stage IB.    POSTOPERATIVE DIAGNOSES:  1. Back melanoma, stage IB.  2. Defect measuring 7.5 x 5.5 x 3.5 cm in the upper back.    NAME OF PROCEDURE:  Intermediate wound closure of the back measuring 7.5 x 5.5 x 3.5 cm.  CPT code [phone].    ANESTHESIA:  General endotracheal anesthesia.    ANESTHESIOLOGIST:  Myrtie Neither, MD.    SURGEON:  Blanchie Dessert, MD    ASSISTANTS:  Dossie Der, MD.   Willa Rough, DO.    START TIME:  Dr. Huel Cote, 12:58 p.m.   Dr. Liliane Bade, 2:20 p.m.    END TIME:  2:57 p.m.    INDICATION FOR PROCEDURE:  Victor Frazier is a 36 year old male patient who has a history of upper back melanoma, stage IB.  He was referred to my clinic by Dr. Huel Cote for evaluation of closure of a defect after resection of the tumor.  We discussed possible closure options including a skin graft and local tissue rearrangement, and we also went over the potential risks and complications associated with this that include but are not limited to anesthesia, bleeding, infection, seroma, hematoma, wound healing problems, pain, scars, less than desirable cosmetic result, need of further procedures, partial or total flap loss, partial total skin graft loss.  After all his questions were answered to his satisfaction, consent was obtained.    DESCRIPTION OF PROCEDURE:  The patient was brought into the operating room.  A timeout was performed per house staff.  He underwent general endotracheal anesthesia.  He received Kefzol induction.  I was called into the operating room at 2:20 p.m. and I found the patient in the prone position with a defect on his back that measure 7.5 x 5.5 x 3.5 cm.  Due to laxity of the tissue, I was able to close the defect in multiple layers with a deep layer and subcutaneous tissue of interrupted sutures of 2-0 Vicryl  followed by deep dermal interrupted sutures of 3-0 Monocryl and skin sutures of horizontal mattress of nylon 3-0.  All counts were correct at the end of this procedure.  Dr. Huel Cote had injected local anesthetic containing lidocaine and Marcaine to the area.  Mastisol and Steri-Strips were applied to the incision, followed by Mepilex silver.  The patient was extubated in the operating room with no complications.    INTRAVENOUS FLUIDS:  1200 mL of crystalloid for the entire procedure.    ESTIMATED BLOOD LOSS:  25 mL for the entire procedure.    COMPLEXITY AND FINDINGS:  Lesion in the upper back.    SPECIMEN:  As per Dr. Huel Cote.    DRAINS:  None.    IMPLANTS:  None.    CONDITION:  Stable.    DISPOSITION:  Hemodynamically stable to recovery area.        Blanchie Dessert, MD  Assistant Professor   [place] Department of Surgery/Plastics              DD:  10/01/2016 18:01:37  DT:  10/03/2016 09:30:41 CB  D#:  [id number]

## 2016-10-04 ENCOUNTER — Telehealth (HOSPITAL_BASED_OUTPATIENT_CLINIC_OR_DEPARTMENT_OTHER): Payer: Self-pay | Admitting: Plastic Surgery

## 2016-10-04 NOTE — Telephone Encounter (Signed)
Spoke to patient to ask how he is doing after procedure on October 01, 2016.  I asked if he has any questions or concerns and if they understand the discharge instructions.  Patient stated he is doing good.  He stated he is a little bit sore but not a lot of pain.  Patient stated he is moving around but being careful on what he does.  Patient stated he doesn't have any questions about his discharge instructions.  Instructed patient to call the Plastics Office should any problems arise. Patient verbalized understanding.  Reminded patient of follow up appointment on October 12, 2016 at 11:45 am with Dr. Liliane Bade at the [facility] office.  Patient stated he will be there.    Renee Pain, RN  10/04/2016, 11:43

## 2016-10-05 LAB — HISTORICAL SURGICAL PATHOLOGY SPECIMEN

## 2016-10-06 ENCOUNTER — Telehealth (HOSPITAL_COMMUNITY): Payer: Self-pay | Admitting: SURGICAL ONCOLOGY

## 2016-10-06 NOTE — Telephone Encounter (Signed)
SURGICAL ONCOLOGY TELEPHONE CONVERSATION DOCUMENTATION:    I have spoken with the patient today regarding the results of their pathology report from recent surgery.  No residual melanoma was seen at the primary site, all lymph nodes were negative.  We will discuss Melanoma Decision Dx genetic testing upon his return for routine post-operative visit.  I explained the implications of these results and the need for any further testing or treatment, if necessary.  The patient had the opportunity to ask any and all questions, which were answered to best of my ability.  They know to contact my office with any further questions or concerns.    Illene Silver, MD 10/06/2016 12:50  Assistant Professor of Surgery  Division of [place]

## 2016-10-08 LAB — HISTORICAL SURGICAL PATHOLOGY SPECIMEN

## 2016-10-12 ENCOUNTER — Encounter (INDEPENDENT_AMBULATORY_CARE_PROVIDER_SITE_OTHER): Payer: Self-pay

## 2016-10-12 ENCOUNTER — Ambulatory Visit: Payer: BC Managed Care – PPO | Attending: SURGICAL ONCOLOGY | Admitting: Plastic Surgery

## 2016-10-12 ENCOUNTER — Encounter (HOSPITAL_BASED_OUTPATIENT_CLINIC_OR_DEPARTMENT_OTHER): Payer: Self-pay | Admitting: Plastic Surgery

## 2016-10-12 ENCOUNTER — Ambulatory Visit (HOSPITAL_BASED_OUTPATIENT_CLINIC_OR_DEPARTMENT_OTHER): Payer: BC Managed Care – PPO | Admitting: Surgical

## 2016-10-12 VITALS — BP 133/84 | HR 88 | Temp 96.7°F | Ht 69.0 in | Wt 279.1 lb

## 2016-10-12 VITALS — BP 134/84 | HR 88 | Temp 97.7°F | Ht 69.0 in | Wt 279.1 lb

## 2016-10-12 DIAGNOSIS — Z483 Aftercare following surgery for neoplasm: Secondary | ICD-10-CM | POA: Insufficient documentation

## 2016-10-12 DIAGNOSIS — Z791 Long term (current) use of non-steroidal anti-inflammatories (NSAID): Secondary | ICD-10-CM | POA: Insufficient documentation

## 2016-10-12 DIAGNOSIS — Z6841 Body Mass Index (BMI) 40.0 and over, adult: Secondary | ICD-10-CM

## 2016-10-12 DIAGNOSIS — Z87891 Personal history of nicotine dependence: Secondary | ICD-10-CM | POA: Insufficient documentation

## 2016-10-12 DIAGNOSIS — C4359 Malignant melanoma of other part of trunk: Secondary | ICD-10-CM

## 2016-10-12 DIAGNOSIS — Z4802 Encounter for removal of sutures: Secondary | ICD-10-CM | POA: Insufficient documentation

## 2016-10-12 DIAGNOSIS — Z79899 Other long term (current) drug therapy: Secondary | ICD-10-CM | POA: Insufficient documentation

## 2016-10-12 DIAGNOSIS — I10 Essential (primary) hypertension: Secondary | ICD-10-CM | POA: Insufficient documentation

## 2016-10-12 DIAGNOSIS — Z08 Encounter for follow-up examination after completed treatment for malignant neoplasm: Secondary | ICD-10-CM

## 2016-10-12 DIAGNOSIS — Z85820 Personal history of malignant melanoma of skin: Secondary | ICD-10-CM | POA: Insufficient documentation

## 2016-10-12 NOTE — Progress Notes (Signed)
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    SURGICAL THERAPY:  1. WLE of right upper back and Right axillary SLNB (Dr. Huel Cote) 10/01/2016  2. Intermediate wound closure of the back (Dr. Liliane Bade) 10/01/2016    SYSTEMIC THERAPY: None    CLINICAL TRIAL: None    CHIEF COMPLAINT: Post-operative evaluation.    HISTORY OF PRESENT ILLNESS:  Victor Frazier is a 36 y.o.    White  Unknown male who returns to clinic today for routine post-operative evaluation following WLE of right upper back and SLNB by Dr. Huel Cote and complex wound closure by Dr. Liliane Bade on 10/01/2016.  The patient's post-operative course was uncomplicated.  They were discharged to Home on same day of surgery.    Today, he presents without complaints. During today's visit, the patient denies drainage, redness/inflammation or tenderness of his surgical incision or drain sites and states that his incisions have healed well.      REVIEW OF SYSTEMS:   Constitutional: pleasant, cooperative, denies fevers, chills or weight loss   HEENT: denies headache, dizziness, double vision, tinnitus, nasal congestion, or difficulty swallowing   Cardiovascular: denies chest pain, palpitations, dyspnea on exertion   Respiratory: denies SOB, cough   Gastrointestinal (GI): denies abdominal pain, nausea, vomiting, constipation, diarrhea   Genitourinary (GU): denies dysuria, hematuria   Musculoskeletal: denies weakness, joint pain   Endocrine: denies polydipsia, polyuria   Neurological: denies dizziness, facial droop, focal motor/sensory deficits   Psychiatric: denies anxiety, depression, sleeping disturbances     PAST MEDICAL, PAST SURGICAL, FAMILY, AND SOCIAL HISTORY:  The patient denies any changes to this portion of his history since his discharge on 10/01/2016.    HOME MEDICATIONS:  Current  Outpatient Prescriptions   Medication Sig   . Ibuprofen (MOTRIN) 200 mg Oral Tablet Take 200 mg by mouth Four times a day as needed for Pain   . metoprolol (LOPRESSOR) 25 mg Oral Tablet Take 25 mg by mouth Every evening        ALLERGIES:  No Known Allergies       PHYSICAL EXAMINATION:  ECOG Performance Status: 0 - Asymptomatic  Karnofsky Performance Status: 100% - normal, no complaints, no signs of disease  General: pleasant, well-developed, well-nourished    White  Unknown male who is in no acute distress.    Vital Signs: BP 134/84  Pulse 88  Temp 36.5 C (97.7 F) (Thermal Scan)   Ht 1.753 m (5\' 9" )  Wt 126.6 kg (279 lb 1.6 oz)  SpO2 97%  BMI 41.22 kg/m2  Neck: supple, trachea is midline, thyroid is symmetric, not enlarged, and without nodularity  Lymphatics: no palpable lymphadenopathy of the cervical, supraclavicular, axillary, or inguinal nodal basins bilaterally  Cardiovascular: regular rate and rhythm, S1, S2 normal, no murmur, click, rub or gallop  Pulmonary: normal effort, chest expands symmetrically, lungs are clear to auscultation bilaterally.   Abdomen: non-distended, normal bowel sounds, no bruits, soft, nontender, no organomegaly or masses  Extremities: Right axilla incision C/D/I with no signs of infection, No cyanosis, or edema  Back: Incision C/D/I with sutures in place. No signs or infection  Neurologic: alert and oriented to person, place, time, and situation, cranial nerves grossly intact, no focal motor or sensory deficits  Psychiatric: speech pattern and movements are normal,  normal mood, affect and judgment    LABORATORY REVIEW (I have personally reviewed all recent laboratory studies):  None    IMAGING REVIEW (I have personally reviewed both the reports and images):  None    PATHOLOGY REVIEW:   1. 10/01/2016 from [facility]: Final Pathologic Diagnosis A. RIGHT AXILLARY SENTINEL LYMPH NODE: Lymph node, negative for malignancy by routine staining and immunostains for SOX10 and Melan A. B.  SKIN, RIGHT UPPER BACK, EXCISION: Skin with biopsy site changes. No residual melanocytic proliferation identified.  2. 08/16/2016 from [facility]: [place]: SKIN, RIGHT BACK, SHAVE BIOPSY:Malignant melanoma (see Cancer Case Summary).  CANCER CASE SUMMARY Procedure: Biopsy, shave. Specimen laterality: Right. Tumor site: Back.  Microscopic satellite nodules: None identified. Histologic type: Nevoid melanoma. Maximum tumor thickness: 1.1 mm. Ulceration: Not identified. Microsatellites: Not identified. Margins: Uninvolved. Melanoma in-situ comes to within <1 mm of a peripheral edge. Mitotic rate: 1/mm2. Lymphovascular invasion: Not identified. Neurotrophism: Not identified. Tumor infiltrating lymphocytes: Present, non-brisk. Tumor regression: Not identified. Pathologic stage: pT2a, Nx.    ASSESSMENT:  36 y.o.  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 Doing very well post-operatively.    PLAN:  1. The patient's post-operative course, physical examination, and pathology reports (a printed report was given) were discussed with him in detail today.  He is doing quite well post-operatively from his WLE and right axillary SLNB and is without complications.  2. We discussed the chance of recurrence of the tumor in both the same and new locations, which can occur anywhere in their body at any time.  The results of a reliable patient outcome prediction tool from the AJCC were presented to the patient (http://melanomaprognosis.net).  This included 1, 2, 5, and 10-year estimated survival rates.  In addition, the patient understands that there is a 10% chance of developing a new primary melanoma within their lifetime.  3. Thus, per NCCN guidelines for stage IA - IIA patients, follow-up should consist of repeat clinical examination every 6 months for 2 years and then annually until 5 years.  In this cohort, routine radiologic imaging and laboratory screening for asymptomatic  patients is not recommended.  4. We then discussed the possibility of DecisionDx-Melanoma testing through [facility]. This prognostic gene profile test was explained to the patient, including that it helps to identify tumors that are at high-risk of metastatic spread within the next 5 years. Patients are classified into either Class 1 and Class 2. Class 2 has a relatively higher risk of spreading, thus additional surveillance methods such as increased frequency of clinic visits and radiographic testing may be instituted in the patient's care. They verbalized understanding and wish to proceed. We will initiate this testing, and they will be called with results once available.  5. I have emphasized continued close dermatologic surveillance with quarterly examinations, and monthly self-skin and lymph node exams. We reviewed the appropriate application of sunscreen including generous application of a broad-spectrum, water-resistant sunscreen with a Sun Protection Factor (SPF) of 30 or more to all exposed skin with reapplication every two hours, even on cloudy days, and after swimming or sweating.  Regarding sun exposure prophylaxis we discussed avoidance of exposure to direct, intense sunlight, especially around midday and to avoid using sun beds, tanning booths, and tanning lamps as an increased risk of melanoma has been reported.  I referred them to the [place] cancer information online website for additional information resources.  6. I have discussed his comorbid conditions and  how they may impact surgical healing or further therapy, including HTN.  7. I will ask that he return to this clinic in 4 weeks for follow-up and repeat clinical examination.  8. All of the patient's and his family member's questions were answered to best of my ability and to their satisfaction. They seem to understand the disease process and the above mentioned treatment algorithm. After deliberation, he agrees to proceed as outlined.   9. The patient has my business card containing all of my professional contact information and I have instructed him to call with new or worsening symptoms, questions, or concerns.  10. I counseled the patient for 10 of the 15 minute visit about their disease. This patient was seen independently in clinic.     Allene Pyo, PA-C 10/14/2016 14:15  Physician Assistant Certified  Division of Surgical Oncology  [facility]    I was not present for this patient encounter.  I have reviewed the chief complaint, history, physical examination, laboratory values, imaging reports, and have discussed the plan of care for this patient as outlined by the physician assistant above.  Please see the physician assistant's note, which I have carefully reviewed, for full details.  Any additions/exceptions are edited/noted.    Illene Silver, MD 10/25/2016 10:35  Assistant Professor of Surgery  Division of [place] [facility]

## 2016-10-14 ENCOUNTER — Encounter (HOSPITAL_BASED_OUTPATIENT_CLINIC_OR_DEPARTMENT_OTHER): Payer: BC Managed Care – PPO | Admitting: Rheumatology

## 2016-10-15 ENCOUNTER — Encounter (HOSPITAL_BASED_OUTPATIENT_CLINIC_OR_DEPARTMENT_OTHER): Payer: Self-pay | Admitting: Plastic Surgery

## 2016-10-15 NOTE — Progress Notes (Signed)
Victor Frazier  [id number]  10/27/1980  10/15/2016          HISTORY AND PHYSICAL EXAM      REFERRING PROVIDER:  Illene Silver, MD  [id number]  [place], [place] [address]        Chief Complaint   Patient presents with   . Post-op #1           HISTORY OF PRESENT ILLNESS:  Victor Frazier is a 36 y.o. male who presents to [facility] today alone for POST OP evaluation of "WLE of back melanoma and intermediate closure on 10/01/16".  The patient states he is doing well and he is happy that his back was closed primarily. He had some pain that is improved and he has no complaints. The patient denies any unusual fatigue, malaise, swelling, CP, or SOB.      History:    Past Medical History:   Diagnosis Date   . Cancer ([place])     melanoma on his back   . Heartburn     diet controlled   . History of anesthesia complications     patient adopted   . HTN (hypertension)    . Malignant melanoma of upper back ([place])    . Wears glasses     reading           Past Surgical History:   Procedure Laterality Date   . EXCISION BENIGN SKIN LESION TRUNK / ARM / LEG      birthmark off left upper back   . HX LAP CHOLECYSTECTOMY     . HX TONSILLECTOMY     . ORBITAL RECONSTRUCTION Left    . SKIN CANCER EXCISION N/A 10/01/2016    Upper back melanoma           Social History     Social History   . Marital status: Single     Spouse name: N/A   . Number of children: N/A   . Years of education: N/A     Occupational History   . Not on file.     Social History Main Topics   . Smoking status: Former Smoker     Start date: 08/20/2011   . Smokeless tobacco: Former Systems developer      Comment: quit chewing 3 years ago   . Alcohol use Yes      Comment: a couple of shot os whiskey on the weekends   . Drug use: No   . Sexual activity: Not on file     Other Topics Concern   . Ability To Walk 1 Flight Of Steps Without Sob/Cp Yes   . Ability To Walk 2 Flight Of Steps Without Sob/Cp Yes   . Total Care No   . Ability To Do Own Adl's Yes   .  Uses Walker No   . Uses Cane No     Social History Narrative       Social History     Social History Narrative       History   Drug Use No       Family Medical History     Problem Relation (Age of Onset)    No Known Problems Other              OB History   No data available         Current Outpatient Prescriptions   Medication Sig   . Ibuprofen (MOTRIN) 200 mg  Oral Tablet Take 200 mg by mouth Four times a day as needed for Pain   . metoprolol (LOPRESSOR) 25 mg Oral Tablet Take 25 mg by mouth Every evening        No Known Allergies        REVIEW OF SYSTEMS: + as per HPI. Denies any headache, dizziness, nasal congestion, sore throat, chest pain, dyspnea, nausea, vomiting, abdomnial pain, diarrhea, constipation, seizures, and ataxia. All other systems were reviewed and are negative.      PHYSICAL EXAM:  Most Recent Vitals       Office Visit from 10/12/2016 in [facility], [place]    Temperature 35.9 C (96.7 F) filed at... 10/12/2016 1136    Heart Rate 88 filed at... 10/12/2016 1136    Respiratory Rate     BP (Non-Invasive) 133/84 filed at... 10/12/2016 1136    Height 1.753 m (5\' 9" ) filed at... 10/12/2016 1136    Weight 126.6 kg (279 lb 1.6 oz) filed at... 10/12/2016 1136    BMI (Calculated) 41.3 filed at... 10/12/2016 1136    BSA (Calculated) 2.48 filed at... 10/12/2016 1136        Gen: Well, NAD, nonseptic   HEENT: NC/AT, sclera anicteric, mucosa moist and pink   Neck: supple, nl ROM   Resp: Non labored respirations on RA  MSK: back incision is c/d/i. No limitations of BUE range of motion  Lymph: no axillary, cervical or supraclavicular adenopathy bilaterally   Neuro: A&Ox3, CN II-XII grossly intact   Skin: Warm, dry.   Psych: Normal mood, affect and behavior. Patient was cooperative with the exam.            DIAGNOSTIC DATA:  PATH 10/01/16  A:RIGHT AXILLARY SENTINEL LYMPH NODE 31    B:RIGHT UPPER BACK MELANOMA          Final Pathologic Diagnosis    A. RIGHT AXILLARY SENTINEL LYMPH NODE:     - Lymph node, negative for malignancy by routine staining and immunostains  for SOX10 and Melan A.     B. SKIN, RIGHT UPPER BACK, EXCISION:    - Skin with biopsy site changes.    - No residual melanocytic proliferation identified.       LABS:   NONE.       ASSESSMENT:  Malignant melanoma of upper back ([place])      PLAN:   1. Part of sutures were removed today. Left middle sutures due to tension in the tissue.  2. Advised patient to continue shower with no dressings and apply a thin layer of antibiotic ointment daily  3. Continue to refrain from heavy lifting (max 10lbs), strenuous activity until post op week6.  4. RTC on 4/17 for suture removal or sooner if needed.      [name] was given the chance to ask questions, and these were answered to their satisfaction. The patient is welcome to call with any questions or concerns in the meantime.         Blanchie Dessert, MD  10/15/2016, 14:53  [place] Department of Reconstructive, Plastic and Hand Surgery

## 2016-10-15 NOTE — Addendum Note (Signed)
Addendum  created 10/15/16 0805 by Myrtie Neither, MD    Anesthesia Attestations filed

## 2016-10-18 ENCOUNTER — Ambulatory Visit: Payer: BC Managed Care – PPO | Admitting: Rheumatology

## 2016-10-19 ENCOUNTER — Ambulatory Visit: Payer: BC Managed Care – PPO | Attending: Plastic Surgery | Admitting: Plastic Surgery

## 2016-10-19 ENCOUNTER — Encounter (HOSPITAL_BASED_OUTPATIENT_CLINIC_OR_DEPARTMENT_OTHER): Payer: Self-pay | Admitting: Plastic Surgery

## 2016-10-19 VITALS — BP 135/84 | HR 67 | Temp 98.0°F | Ht 69.0 in | Wt 281.7 lb

## 2016-10-19 DIAGNOSIS — Z79899 Other long term (current) drug therapy: Secondary | ICD-10-CM | POA: Insufficient documentation

## 2016-10-19 DIAGNOSIS — Z08 Encounter for follow-up examination after completed treatment for malignant neoplasm: Secondary | ICD-10-CM

## 2016-10-19 DIAGNOSIS — Z87891 Personal history of nicotine dependence: Secondary | ICD-10-CM | POA: Insufficient documentation

## 2016-10-19 DIAGNOSIS — C4359 Malignant melanoma of other part of trunk: Secondary | ICD-10-CM | POA: Insufficient documentation

## 2016-10-19 NOTE — Progress Notes (Signed)
Victor Frazier  [id number]  Nov 21, 1980  10/19/2016      PROGRESS NOTE      REFERRING PROVIDER:  Illene Silver, MD  [address]  [place], [place] [address]      Chief Complaint   Patient presents with   . Skin Cancer     upper back       HISTORY OF PRESENT ILLNESS:  Victor Frazier is a 36 y.o. male who presents to [facility] today alone for POST OP evaluation of "WLE of back melanoma and intermediate closure on 10/01/16".  The patient states he has continued to do well since last visit and reports no changes since last visit.  He denies any drainage. He denies pain and states he has been honoring activity restrictions. The patient denies any unusual fatigue, malaise, swelling, CP, or SOB.  He has further follow up with Dr. Huel Cote on Nov 08, 2016.       History:    Past Medical History:   Diagnosis Date   . Cancer ([place])     melanoma on his back   . Heartburn     diet controlled   . History of anesthesia complications     patient adopted   . HTN (hypertension)    . Malignant melanoma of upper back ([place])    . Wears glasses     reading           Past Surgical History:   Procedure Laterality Date   . EXCISION BENIGN SKIN LESION TRUNK / ARM / LEG      birthmark off left upper back   . HX LAP CHOLECYSTECTOMY     . HX TONSILLECTOMY     . ORBITAL RECONSTRUCTION Left    . SKIN CANCER EXCISION N/A 10/01/2016    Upper back melanoma           Social History     Social History   . Marital status: Single     Spouse name: N/A   . Number of children: N/A   . Years of education: N/A     Occupational History   . Not on file.     Social History Main Topics   . Smoking status: Former Smoker     Start date: 08/20/2011   . Smokeless tobacco: Former Systems developer      Comment: quit chewing 3 years ago   . Alcohol use Yes      Comment: a couple of shot os whiskey on the weekends   . Drug use: No   . Sexual activity: Not on file     Other Topics Concern   . Ability To Walk 1 Flight Of Steps Without Sob/Cp Yes   .  Ability To Walk 2 Flight Of Steps Without Sob/Cp Yes   . Total Care No   . Ability To Do Own Adl's Yes   . Uses Walker No   . Uses Cane No     Social History Narrative       Social History     Social History Narrative       History   Drug Use No       Family Medical History     Problem Relation (Age of Onset)    No Known Problems Other              OB History   No data available  Current Outpatient Prescriptions   Medication Sig   . Ibuprofen (MOTRIN) 200 mg Oral Tablet Take 200 mg by mouth Four times a day as needed for Pain   . metoprolol (LOPRESSOR) 25 mg Oral Tablet Take 25 mg by mouth Every evening        No Known Allergies        REVIEW OF SYSTEMS: + as per HPI. Denies any headache, dizziness, nasal congestion, sore throat, chest pain, dyspnea, nausea, vomiting, abdomnial pain, diarrhea, constipation, seizures, and ataxia. All other systems were reviewed and are negative.      PHYSICAL EXAM:  Most Recent Vitals       Office Visit from 10/19/2016 in [facility], [place]    Temperature 36.7 C (98 F) filed at... 10/19/2016 0835    Heart Rate 67 filed at... 10/19/2016 0835    Respiratory Rate     BP (Non-Invasive) 135/84 filed at... 10/19/2016 0835    Height 1.753 m (5\' 9" ) filed at... 10/19/2016 0835    Weight 127.8 kg (281 lb 12 oz) filed at... 10/19/2016 0835    BMI (Calculated) 41.69 filed at... 10/19/2016 0835    BSA (Calculated) 2.49 filed at... 10/19/2016 0835        Gen: Well, NAD, nonseptic   HEENT: NC/AT, sclera anicteric, mucosa moist and pink   Neck: supple, nl ROM   Resp: Non labored respirations on RA  MSK: back incision is c/d/i. Sutures remain.  Well approximated.  No open areas, erythema, or drainage.  No limitations of BUE range of motion  Lymph: no axillary, cervical or supraclavicular adenopathy bilaterally   Neuro: A&Ox3, CN II-XII grossly intact   Skin: Warm, dry.   Psych: Normal mood, affect and behavior. Patient was cooperative with the exam.        DIAGNOSTIC DATA:   PATH 10/01/16  A:RIGHT AXILLARY SENTINEL LYMPH NODE 31    B:RIGHT UPPER BACK MELANOMA          Final Pathologic Diagnosis    A. RIGHT AXILLARY SENTINEL LYMPH NODE:    - Lymph node, negative for malignancy by routine staining and immunostains  for SOX10 and Melan A.     B. SKIN, RIGHT UPPER BACK, EXCISION:    - Skin with biopsy site changes.    - No residual melanocytic proliferation identified.       LABS:   NONE.       ASSESSMENT:  Malignant melanoma of upper back ([place])      PLAN:   1. The patient is healing well s/p closure of melanoma excision.  His incision remains well approximated today.  Remaining sutures removed.    2. Continue no lifting more than 10 lbs until 6 weeks post op.  He understands.    3. Moisturize skin to keep well conditioned.  Protect from sun.  4. RTC PRN.  Continue follow up as scheduled with Dr. Huel Cote.      [name] was given the chance to ask questions, and these were answered to their satisfaction. The patient is welcome to call with any questions or concerns in the meantime.     This patient was seen in conjunction with DR. Radene Ou, cosigning physician, available for consultation.     Graylon Good, APRN,FNP-BC  10/19/2016, 12:44  [place] Department of Reconstructive, Plastic and Hand Surgery        DOS: 10/19/16    I saw and examined the patient.  I reviewed the APP's note .I  agree with APP's assessment and physical examination. Patient is well with no complaints.   On examination:   Well, NAD  Unlabored respirations on RA  AAOX3  Back: Incisions are healing as expected. Sutures intact.     Plan: Will d/c sutures in the clinic today. Continue with activity restrictions.   He verbalized understanding. All his questions were answered to satisfaction.

## 2016-10-20 ENCOUNTER — Ambulatory Visit (HOSPITAL_BASED_OUTPATIENT_CLINIC_OR_DEPARTMENT_OTHER): Payer: Self-pay | Admitting: Plastic Surgery

## 2016-10-20 NOTE — Telephone Encounter (Signed)
[place]    Pt calling regarding he had stitches removed yesterday but when he woke up this morning he heard a pop and then his shirt is all wet. Please call him to discuss. Thank you.          Call History         Type Contact Phone User     10/20/2016 11:33 AM Phone (Incoming) Tison, Leibold (Self) [phone] Lemmie Evens) Mahaney, Heidi D         Contacted the patient back and he stated that this morning when he got out of bed he heard a pop and didn't think anything of it until he went to sit down on the couch and felt his shirt was wet.  He stated it was clear yellow fluid.  He stated he tried to look at it and doesn't think there is a wound.  He stated there may be a tiny hold but the wound is still closed.  Advised patient to put some triple antibiotic or vaseline and cover with some gauze.  Patient verbalized understanding.  Notified him if the incision opens or has any thick, odorous, green/white drainage, redness, swelling, or increased pain to contact the Plastics Office.  Patient verbalized understanding.  Advised patient to contact the Plastics Office with any other questions or concerns.    Renee Pain, RN  10/20/2016, 11:45

## 2016-10-21 ENCOUNTER — Ambulatory Visit (HOSPITAL_BASED_OUTPATIENT_CLINIC_OR_DEPARTMENT_OTHER): Payer: Self-pay | Admitting: Plastic Surgery

## 2016-10-21 ENCOUNTER — Ambulatory Visit: Payer: BC Managed Care – PPO | Attending: Plastic Surgery | Admitting: Nurse Practitioner

## 2016-10-21 ENCOUNTER — Encounter (HOSPITAL_BASED_OUTPATIENT_CLINIC_OR_DEPARTMENT_OTHER): Payer: Self-pay | Admitting: Nurse Practitioner

## 2016-10-21 VITALS — BP 128/84 | HR 86 | Temp 96.8°F | Ht 69.0 in | Wt 279.1 lb

## 2016-10-21 DIAGNOSIS — Z87891 Personal history of nicotine dependence: Secondary | ICD-10-CM | POA: Insufficient documentation

## 2016-10-21 DIAGNOSIS — Z48817 Encounter for surgical aftercare following surgery on the skin and subcutaneous tissue: Secondary | ICD-10-CM | POA: Insufficient documentation

## 2016-10-21 DIAGNOSIS — C4359 Malignant melanoma of other part of trunk: Secondary | ICD-10-CM | POA: Insufficient documentation

## 2016-10-21 DIAGNOSIS — Z85820 Personal history of malignant melanoma of skin: Secondary | ICD-10-CM | POA: Insufficient documentation

## 2016-10-21 DIAGNOSIS — J309 Allergic rhinitis, unspecified: Secondary | ICD-10-CM | POA: Insufficient documentation

## 2016-10-21 NOTE — Telephone Encounter (Signed)
Patient called in this morning stating his incision is still draining and he can hear noises coming out of it.  He stated it sounds like whistling noises and air.  He stated he went to the nurse last night at his work and she got some fluid out of it and placed some steri strips on it.  He stated he would just like someone to check it out to make sure it is okay.  Notified him I would talk with Dr. Liliane Bade and see if she wants to see him or if Almyra Free can see him and give him a call back.  Patient verbalized understanding.    Notified Dr. Liliane Bade and she advised for him to see Almyra Free and also to make sure Almyra Free tells him even though his sutures are out, he still can't be lifting anything.  Notified Almyra Free and scheduled the patient an appointment.    Contacted the patient back and notified him that Almyra Free could see him at the [facility] office at 9:30.  Patient stated he will be there.  Advised him to contact the Plastics Office with any other questions or concerns.    Renee Pain, RN  10/21/2016, 08:47

## 2016-10-21 NOTE — Progress Notes (Signed)
Victor Frazier  [id number]  1980-07-07  10/21/2016      PROGRESS NOTE      REFERRING PROVIDER:  Illene Silver, MD  [address]  [place], [place] [address]      Chief Complaint   Patient presents with   . Wound Check       HISTORY OF PRESENT ILLNESS:  Victor Frazier is a 36 y.o. male who presents to [facility] today alone for POST OP evaluation of "WLE of back melanoma and intermediate closure on 10/01/16".  The patient states after his visit on Tuesday he "went to put on a shirt" and felt a pop.  After that, he noticed some clear yellow/orange drainage on his shirt.  He states he has continued to notice drainage.  He denies any pain or erythema. He denies doing any strenuous activity.  He states "All I do is use my computer at work."  The patient denies any unusual fatigue, malaise, swelling, CP, or SOB.  He has further follow up with Dr. Huel Cote on Nov 08, 2016.       History:    Past Medical History:   Diagnosis Date   . Cancer ([place])     melanoma on his back   . Heartburn     diet controlled   . History of anesthesia complications     patient adopted   . HTN (hypertension)    . 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 Wears glasses     reading       Past Surgical History:   Procedure Laterality Date   . EXCISION BENIGN SKIN LESION TRUNK / ARM / LEG      birthmark off left upper back   . HX LAP CHOLECYSTECTOMY     . HX TONSILLECTOMY     . ORBITAL RECONSTRUCTION Left    . SKIN CANCER EXCISION N/A 10/01/2016    Upper back melanoma       Social History     Social History   . Marital status: Single     Spouse name: N/A   . Number of children: N/A   . Years of education: N/A     Occupational History   . Not on file.     Social History Main Topics   . Smoking status: Former Smoker     Start date: 08/20/2011   . Smokeless tobacco: Former Systems developer      Comment: quit chewing 3 years ago   . Alcohol use Yes      Comment: a couple of shot os whiskey on the weekends   . Drug use: No   . Sexual  activity: Not on file     Other Topics Concern   . Ability To Walk 1 Flight Of Steps Without Sob/Cp Yes   . Ability To Walk 2 Flight Of Steps Without Sob/Cp Yes   . Total Care No   . Ability To Do Own Adl's Yes   . Uses Walker No   . Uses Cane No     Social History Narrative       Social History     Social History Narrative       History   Drug Use No       Family Medical History     Problem Relation (Age of Onset)    No Known Problems Other          OB History  No data available       Current Outpatient Prescriptions   Medication Sig   . Ibuprofen (MOTRIN) 200 mg Oral Tablet Take 200 mg by mouth Four times a day as needed for Pain   . metoprolol (LOPRESSOR) 25 mg Oral Tablet Take 25 mg by mouth Every evening        No Known Allergies      REVIEW OF SYSTEMS: + as per HPI. Denies any headache, dizziness, nasal congestion, sore throat, chest pain, dyspnea, nausea, vomiting, abdomnial pain, diarrhea, constipation, seizures, and ataxia. All other systems were reviewed and are negative.    PHYSICAL EXAM:    Most Recent Vitals       Office Visit from 10/21/2016 in [facility], [place]    Temperature 36 C (96.8 F) filed at... 10/21/2016 0929    Heart Rate 86 filed at... 10/21/2016 0929    Respiratory Rate     BP (Non-Invasive) 128/84 filed at... 10/21/2016 0929    Height 1.753 m (5\' 9" ) filed at... 10/21/2016 0929    Weight 126.6 kg (279 lb 1.6 oz) filed at... 10/21/2016 0929    BMI (Calculated) 41.3 filed at... 10/21/2016 0929    BSA (Calculated) 2.48 filed at... 10/21/2016 0929        Gen: Well, NAD, nonseptic   HEENT: NC/AT, sclera anicteric, mucosa moist and pink   Neck: supple, nl ROM   Resp: Non labored respirations on RA  MSK: His R upper back incision remains well approximated with the exception of a pinpoint opening noted medially.  No drainage noted today.  No erythema or tenderness.  No limitations of BUE range of motion  Lymph: no axillary, cervical or supraclavicular adenopathy bilaterally.    Neuro: A&Ox3, CN II-XII grossly intact.   Skin: Warm, dry.   Psych: Normal mood, affect and behavior. Patient was cooperative with the exam.        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 Probably seroma drainage after melanoma excision.  Small pinpoint area noted at midpoint of patient's incision.  Steristrips applied for reinforcement.  For compression since the patient has had drainage, I placed an abd pad over the area and secured with breast band.  Advised patient to wear consistently if he can tolerate. He states he will.    2. Continue no lifting more than 10 lbs.  He understands.    3. Moisturize skin to keep well conditioned.  Protect from sun.  4. RTC 2 weeks.  Call sooner with any concerns or questions.    Victor Frazier was given the chance to ask questions, and these were answered to their satisfaction. The patient is welcome to call with any questions or concerns in the meantime.     This patient was seen independently with Dr. Radene Ou, cosigning physician, available for consultation.     Victor Good, APRN,FNP-BC  10/21/2016, 11:11  [place] Department of Reconstructive, Plastic and Hand Surgery

## 2016-10-22 ENCOUNTER — Ambulatory Visit: Payer: BC Managed Care – PPO | Attending: Plastic Surgery | Admitting: Nurse Practitioner

## 2016-10-22 ENCOUNTER — Telehealth (HOSPITAL_BASED_OUTPATIENT_CLINIC_OR_DEPARTMENT_OTHER): Payer: Self-pay | Admitting: Plastic Surgery

## 2016-10-22 ENCOUNTER — Encounter (HOSPITAL_BASED_OUTPATIENT_CLINIC_OR_DEPARTMENT_OTHER): Payer: Self-pay | Admitting: Nurse Practitioner

## 2016-10-22 VITALS — BP 138/88 | HR 128 | Temp 96.6°F | Ht 69.0 in | Wt 278.7 lb

## 2016-10-22 DIAGNOSIS — C4359 Malignant melanoma of other part of trunk: Secondary | ICD-10-CM

## 2016-10-22 DIAGNOSIS — I10 Essential (primary) hypertension: Secondary | ICD-10-CM | POA: Insufficient documentation

## 2016-10-22 DIAGNOSIS — Z791 Long term (current) use of non-steroidal anti-inflammatories (NSAID): Secondary | ICD-10-CM | POA: Insufficient documentation

## 2016-10-22 DIAGNOSIS — Z483 Aftercare following surgery for neoplasm: Secondary | ICD-10-CM | POA: Insufficient documentation

## 2016-10-22 DIAGNOSIS — Z87891 Personal history of nicotine dependence: Secondary | ICD-10-CM | POA: Insufficient documentation

## 2016-10-22 DIAGNOSIS — Z85820 Personal history of malignant melanoma of skin: Secondary | ICD-10-CM | POA: Insufficient documentation

## 2016-10-22 DIAGNOSIS — Z79899 Other long term (current) drug therapy: Secondary | ICD-10-CM | POA: Insufficient documentation

## 2016-10-22 DIAGNOSIS — S21209A Unspecified open wound of unspecified back wall of thorax without penetration into thoracic cavity, initial encounter: Secondary | ICD-10-CM

## 2016-10-22 DIAGNOSIS — T8131XA Disruption of external operation (surgical) wound, not elsewhere classified, initial encounter: Secondary | ICD-10-CM | POA: Insufficient documentation

## 2016-10-22 NOTE — Progress Notes (Signed)
Victor Frazier  [id number]  14-Jul-1980  10/22/2016      PROGRESS NOTE      REFERRING PROVIDER:  Illene Silver, MD  [address]  [place], [place] [address]      Chief Complaint   Patient presents with   . Surgical Follow-up       HISTORY OF PRESENT ILLNESS:  Odell Choung is a 36 y.o. male who presents to [facility] today alone for POST OP evaluation of "WLE of back melanoma and intermediate closure on 10/01/16".  The patient states after his visit on Tuesday he "went to put on a shirt" and felt a pop.  After that, he noticed some clear yellow/orange drainage on his shirt.  He was evaluated yesterday and a pinpoint opening was noted along his incision.  He states "The hole got bigger today, and the nurse at my work says it tunnels down."  He denies any pain or erythema. He denies doing any strenuous activity.  The patient denies any unusual fatigue, malaise, swelling, CP, or SOB.  He has further follow up with Dr. Huel Cote on Nov 08, 2016.       History:    Past Medical History:   Diagnosis Date   . Cancer ([place])     melanoma on his back   . Heartburn     diet controlled   . History of anesthesia complications     patient adopted   . HTN (hypertension)    . 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 Wears glasses     reading       Past Surgical History:   Procedure Laterality Date   . EXCISION BENIGN SKIN LESION TRUNK / ARM / LEG      birthmark off left upper back   . HX LAP CHOLECYSTECTOMY     . HX TONSILLECTOMY     . ORBITAL RECONSTRUCTION Left    . SKIN CANCER EXCISION N/A 10/01/2016    Upper back melanoma       Social History     Social History   . Marital status: Single     Spouse name: N/A   . Number of children: N/A   . Years of education: N/A     Occupational History   . Not on file.     Social History Main Topics   . Smoking status: Former Smoker     Start date: 08/20/2011   . Smokeless tobacco: Former Systems developer      Comment: quit chewing 3 years ago   . Alcohol use Yes       Comment: a couple of shot os whiskey on the weekends   . Drug use: No   . Sexual activity: Not on file     Other Topics Concern   . Ability To Walk 1 Flight Of Steps Without Sob/Cp Yes   . Ability To Walk 2 Flight Of Steps Without Sob/Cp Yes   . Total Care No   . Ability To Do Own Adl's Yes   . Uses Walker No   . Uses Cane No     Social History Narrative       Social History     Social History Narrative       History   Drug Use No       Family Medical History     Problem Relation (Age of Onset)    No Known Problems Other  OB History   No data available       Current Outpatient Prescriptions   Medication Sig   . Ibuprofen (MOTRIN) 200 mg Oral Tablet Take 200 mg by mouth Four times a day as needed for Pain   . metoprolol (LOPRESSOR) 25 mg Oral Tablet Take 25 mg by mouth Every evening        No Known Allergies      REVIEW OF SYSTEMS: + as per HPI. Denies any headache, dizziness, nasal congestion, sore throat, chest pain, dyspnea, nausea, vomiting, abdomnial pain, diarrhea, constipation, seizures, and ataxia. All other systems were reviewed and are negative.    PHYSICAL EXAM:    Most Recent Vitals       Office Visit from 10/22/2016 in [facility], [place]    Temperature 35.9 C (96.6 F) filed at... 10/22/2016 1408    Heart Rate (!)  128 filed at... 10/22/2016 1408    Respiratory Rate     BP (Non-Invasive) 138/88 filed at... 10/22/2016 1408    Height 1.753 m (5\' 9" ) filed at... 10/22/2016 1408    Weight 126.4 kg (278 lb 10.6 oz) filed at... 10/22/2016 1408    BMI (Calculated) 41.24 filed at... 10/22/2016 1408    BSA (Calculated) 2.48 filed at... 10/22/2016 1408       Gen: Well, NAD, nonseptic   HEENT: NC/AT, sclera anicteric, mucosa moist and pink   Neck: supple, nl ROM   Resp: Non labored respirations on RA  MSK: His R upper back incision remains well approximated with the exception of 0.8 circular opening medially which tunnels 6 cm inferiorly. Able to express serous drainage when pressure is  applied to inferior portion of incision.  No erythema or tenderness.    Lymph: no axillary, cervical or supraclavicular adenopathy bilaterally.   Neuro: A&Ox3, CN II-XII grossly intact.   Skin: Warm, dry.   Psych: Normal mood, affect and behavior. Patient was cooperative with the exam.        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 Open area at mid point of incision post melanoma excision and closure with serous drainage.  The opening is 0.8 cm but tunnels 6 cm inferiorly.  Able to express serous drainage when pressure is applied inferiorly.  Will pack daily with ribbon gauze.  Keep compression with ACE bandage over inferior portion of incision to help with pocket closure.  No s/s of infection today.    2. Stressed the importance of continuing no lifting more than 10 lbs.  He understands.    3. Moisturize skin to keep well conditioned.  Protect from sun.  4. RTC May 8th as previously scheduled.  Call sooner with any concerns or questions.    [name] was given the chance to ask questions, and these were answered to their satisfaction. The patient is welcome to call with any questions or concerns in the meantime.     This patient was seen independently with Dr. Blanchie Dessert, cosigning physician, available for consultation.     Graylon Good, APRN,FNP-BC  10/22/2016, 14:30  [place] Department of Reconstructive, Plastic and Hand Surgery

## 2016-10-22 NOTE — Telephone Encounter (Signed)
Margaretha Sheffield, nurse from [facility] stated Hilery came in to see her today and part of his surgical wound dehised.  She stated the incision is open about half of a cm and also tunnels down about 6 cm.  She stated she feels it will probably need packed but would like for someone to look at it today.  She stated she is able to pack his wound daily at her clinic and she has experience with wound care.  She stated she would feel more comfortable with someone looking at him today to make sure it is okay.  Notified her I could add him onto Julie's schedule around 2pm.  She verbalized understanding.  Advised her to contact the Plastics Office with any other questions or concerns.    Renee Pain, RN  10/22/2016, 13:36

## 2016-11-08 ENCOUNTER — Ambulatory Visit: Payer: BC Managed Care – PPO | Attending: SURGICAL ONCOLOGY | Admitting: SURGICAL ONCOLOGY

## 2016-11-08 VITALS — BP 110/80 | HR 100 | Temp 97.9°F | Ht 69.0 in | Wt 281.7 lb

## 2016-11-08 DIAGNOSIS — Z08 Encounter for follow-up examination after completed treatment for malignant neoplasm: Secondary | ICD-10-CM

## 2016-11-08 DIAGNOSIS — Z483 Aftercare following surgery for neoplasm: Secondary | ICD-10-CM | POA: Insufficient documentation

## 2016-11-08 DIAGNOSIS — Z79899 Other long term (current) drug therapy: Secondary | ICD-10-CM | POA: Insufficient documentation

## 2016-11-08 DIAGNOSIS — C4359 Malignant melanoma of other part of trunk: Secondary | ICD-10-CM | POA: Insufficient documentation

## 2016-11-08 DIAGNOSIS — Z791 Long term (current) use of non-steroidal anti-inflammatories (NSAID): Secondary | ICD-10-CM | POA: Insufficient documentation

## 2016-11-08 NOTE — Progress Notes (Signed)
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    SURGICAL THERAPY:  1. WLE of right upper back and Right axillary SLNB (Dr. Huel Cote) 10/01/2016  2. Intermediate wound closure of the back (Dr. Liliane Bade) 10/01/2016    SYSTEMIC THERAPY: None    CLINICAL TRIAL: None    CHIEF COMPLAINT: Post-operative evaluation.    HISTORY OF PRESENT ILLNESS:  Victor Frazier is a 36 y.o. male who returns to clinic today for wound check following WLE of right upper back and SLNB by Dr. Huel Cote and complex wound closure by Dr. Liliane Bade on 10/01/2016. The patient was last seen in clinic on 10/12/2016 and since that time the medial portion of his back incision has opened and is being packed daily. Today, he presents without complaints. During today's visit, he admits to serosanguinous drainage from his wound, he denies redness/inflammation or tenderness of his surgical incision site.    REVIEW OF SYSTEMS:   Constitutional: pleasant, cooperative, denies fevers, chills or weight loss   HEENT: denies headache, dizziness, double vision, tinnitus, nasal congestion, or difficulty swallowing   Cardiovascular: denies chest pain, palpitations, dyspnea on exertion   Respiratory: denies SOB, cough   Gastrointestinal (GI): denies abdominal pain, nausea, vomiting, constipation, diarrhea   Genitourinary (GU): denies dysuria, hematuria   Musculoskeletal: denies weakness, joint pain   Endocrine: denies polydipsia, polyuria   Neurological: denies dizziness, facial droop, focal motor/sensory deficits   Psychiatric: denies anxiety, depression, sleeping disturbances     PAST MEDICAL, PAST SURGICAL, FAMILY, AND SOCIAL HISTORY:  The patient denies any changes to this portion of his history since 10/12/2016 except for his wound dehiscence.     HOME MEDICATIONS:  Current Outpatient Prescriptions    Medication Sig   . Ibuprofen (MOTRIN) 200 mg Oral Tablet Take 200 mg by mouth Four times a day as needed for Pain   . metoprolol (LOPRESSOR) 25 mg Oral Tablet Take 25 mg by mouth Every evening        ALLERGIES:  No Known Allergies       PHYSICAL EXAMINATION:  ECOG Performance Status: 0 - Asymptomatic  Karnofsky Performance Status: 100% - normal, no complaints, no signs of disease  General: pleasant, well-developed, well-nourished    White  Unknown male who is in no acute distress.    Vital Signs: BP 110/80  Pulse 100  Temp 36.6 C (97.9 F) (Thermal Scan)   Ht 1.753 m (5\' 9" )  Wt 127.8 kg (281 lb 12 oz)  SpO2 93% Comment: room air  BMI 41.61 kg/m2  Neck: supple, trachea is midline, thyroid is symmetric, not enlarged, and without nodularity  Lymphatics: no palpable lymphadenopathy of the cervical, supraclavicular, axillary, or inguinal nodal basins bilaterally  Cardiovascular: regular rate and rhythm, S1, S2 normal, no murmur, click, rub or gallop  Pulmonary: normal effort, chest expands symmetrically, lungs are clear to auscultation bilaterally.   Abdomen: non-distended, normal bowel sounds, no bruits, soft, nontender, no organomegaly or masses  Extremities: Right axilla incision C/D/I with no signs of infection, No cyanosis, or edema  Back: Incision C/D/I with sutures in place. No signs or infection  Neurologic: alert and oriented to person, place, time, and situation, cranial nerves grossly intact, no focal motor or sensory deficits  Psychiatric: speech pattern and movements are normal, normal  mood, affect and judgment    LABORATORY REVIEW (I have personally reviewed all recent laboratory studies):  None    IMAGING REVIEW (I have personally reviewed both the reports and images):  None    PATHOLOGY REVIEW:   1. 10/01/2016 from [facility]: Final Pathologic Diagnosis A. RIGHT AXILLARY SENTINEL LYMPH NODE: Lymph node, negative for malignancy by routine staining and immunostains for SOX10 and Melan A. B. SKIN, RIGHT  UPPER BACK, EXCISION: Skin with biopsy site changes. No residual melanocytic proliferation identified.  2. 08/16/2016 from [facility]: [place]: SKIN, RIGHT BACK, SHAVE BIOPSY:Malignant melanoma (see Cancer Case Summary).  CANCER CASE SUMMARY Procedure: Biopsy, shave. Specimen laterality: Right. Tumor site: Back.  Microscopic satellite nodules: None identified. Histologic type: Nevoid melanoma. Maximum tumor thickness: 1.1 mm. Ulceration: Not identified. Microsatellites: Not identified. Margins: Uninvolved. Melanoma in-situ comes to within <1 mm of a peripheral edge. Mitotic rate: 1/mm2. Lymphovascular invasion: Not identified. Neurotrophism: Not identified. Tumor infiltrating lymphocytes: Present, non-brisk. Tumor regression: Not identified. Pathologic stage: pT2a, Nx.    ASSESSMENT:  36 y.o.  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     PLAN:  1. The patient's physical exam was discussed with him in detail today.  2. We agree with plastic surgery to continue dressing changes.   3. We discussed the chance of recurrence of the tumor in both the same and new locations, which can occur anywhere in their body at any time.  The results of a reliable patient outcome prediction tool from the AJCC were presented to the patient (http://melanomaprognosis.net).  This included 1, 2, 5, and 10-year estimated survival rates.  In addition, the patient understands that there is a 10% chance of developing a new primary melanoma within their lifetime.  4. Thus, per NCCN guidelines for stage IA - IIA patients, follow-up should consist of repeat clinical examination every 6 months for 2 years and then annually until 5 years.  In this cohort, routine radiologic imaging and laboratory screening for asymptomatic patients is not recommended.  5. We then discussed the possibility of DecisionDx-Melanoma testing through [facility]. This prognostic gene profile test was explained  to the patient, including that it helps to identify tumors that are at high-risk of metastatic spread within the next 5 years. Patients are classified into either Class 1 and Class 2. Class 2 has a relatively higher risk of spreading, thus additional surveillance methods such as increased frequency of clinic visits and radiographic testing may be instituted in the patient's care. They verbalized understanding and wish to proceed. We will initiate this testing, and they will be called with results once available.  6. I have emphasized continued close dermatologic surveillance with quarterly examinations, and monthly self-skin and lymph node exams. We reviewed the appropriate application of sunscreen including generous application of a broad-spectrum, water-resistant sunscreen with a Sun Protection Factor (SPF) of 30 or more to all exposed skin with reapplication every two hours, even on cloudy days, and after swimming or sweating.  Regarding sun exposure prophylaxis we discussed avoidance of exposure to direct, intense sunlight, especially around midday and to avoid using sun beds, tanning booths, and tanning lamps as an increased risk of melanoma has been reported.  I referred them to the [place] cancer information online website for additional information resources.  7. I have discussed his comorbid conditions and how they may impact surgical healing or further therapy, including HTN.  8. I will ask that he return to  this clinic in 6 months for follow-up and repeat clinical examination.  9. All of the patient's questions were answered to best of my ability and to his satisfaction. He seems to understand the disease process and the above mentioned treatment algorithm. After deliberation, he agrees to proceed as outlined.  10. The patient has my business card containing all of my professional contact information and I have instructed him to call with new or worsening symptoms, questions, or concerns.  11. I counseled the  patient for 10 of the 15 minute visit about their disease.    Allene Pyo, PA-C 11/08/2016 12:22  Physician Assistant Certified  Division of Surgical Oncology  [facility]    I saw and examined this patient with the physician assistant above.  Please see the physician assistant's note, which I have carefully reviewed, for full details. I agree with the findings and plan of care as documented in the physician assistant's note.  Any additions/exceptions are edited/noted.    Since we have last seen him, his right upper back incision has opened slightly in the mid pole and is being packed, this is healing well, less drainage, no evidence for infection.  On examination, the opening is obvious, no infection, no nodularity, no evidence for recurrence, axillary incision is well healed without complication.  We discussed surveillance and further appointments that will be necessary in the future.  He will need dermatologic surveillance at least twice yearly.  Sun protection methods were discussed in detail.  We are still awaiting results of Melanoma Decision Dx testing and I will call him once those are available.  He will follow up with Dr. Liliane Bade for wound healing concerns.  Follow up in this office in 6 months for repeat clinical examination.    Illene Silver, MD 11/10/2016 10:57  Assistant Professor of Surgery  Division of [facility]

## 2016-11-09 ENCOUNTER — Encounter (HOSPITAL_BASED_OUTPATIENT_CLINIC_OR_DEPARTMENT_OTHER): Payer: Self-pay | Admitting: Nurse Practitioner

## 2016-11-09 ENCOUNTER — Ambulatory Visit: Payer: BC Managed Care – PPO | Attending: Plastic Surgery | Admitting: Nurse Practitioner

## 2016-11-09 VITALS — BP 133/85 | HR 86 | Temp 98.0°F | Ht 69.0 in | Wt 280.9 lb

## 2016-11-09 DIAGNOSIS — C4359 Malignant melanoma of other part of trunk: Secondary | ICD-10-CM | POA: Insufficient documentation

## 2016-11-09 DIAGNOSIS — S21201D Unspecified open wound of right back wall of thorax without penetration into thoracic cavity, subsequent encounter: Secondary | ICD-10-CM | POA: Insufficient documentation

## 2016-11-09 NOTE — Progress Notes (Signed)
Victor Frazier  [id number]  06/29/1981  11/09/2016      PROGRESS NOTE      REFERRING PROVIDER:  Illene Silver, MD  [address]  [place], [place] [address]      Chief Complaint   Patient presents with    Wound Check       HISTORY OF PRESENT ILLNESS:  Victor Frazier is a 36 y.o. male who presents to [facility] today alone for POST OP evaluation of "WLE of back melanoma and intermediate closure on 10/01/16".  He subsequently developed an opening at the midpoint of the incision that was draining last visit.  He has been packing the area daily with ribbon gauze, and he states "It's not nearly as deep as it was before."  His drainage is decreasing as well.   He denies any pain or erythema.  The patient denies any unusual fatigue, malaise, swelling, CP, or SOB.  He had follow up yesterday with Dr. Roland Rack office and will follow with them every 6 months.        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     Social History Main Topics    Smoking status: Former Smoker     Start date: 08/20/2011    Smokeless tobacco: Former Systems developer      Comment: quit chewing 3 years ago    Alcohol use Yes      Comment: a couple of shot os whiskey on the weekends    Drug use: No    Sexual activity: Not  on file     Other Topics Concern    Ability To Walk 1 Flight Of Steps Without Sob/Cp Yes    Ability To Walk 2 Flight Of Steps Without Sob/Cp Yes    Total Care No    Ability To Do Own Adl's Yes    Uses Walker No    Uses Cane No     Social History Narrative       Social History     Social History Narrative       History   Drug Use No       Family Medical History     Problem Relation (Age of Onset)    No Known Problems Other          OB History   No data available  Current Outpatient Prescriptions   Medication Sig    fexofenadine (ALLEGRA) 180 mg Oral Tablet Take 180 mg by mouth Once a day    Ibuprofen (MOTRIN) 200 mg Oral Tablet Take 200 mg by mouth Four times a day as needed for Pain    metoprolol (LOPRESSOR) 25 mg Oral Tablet Take 25 mg by mouth Every evening        No Known Allergies      REVIEW OF SYSTEMS: + as per HPI. Denies any headache, dizziness, nasal congestion, sore throat, chest pain, dyspnea, nausea, vomiting, abdomnial pain, diarrhea, constipation, seizures, and ataxia. All other systems were reviewed and are negative.    PHYSICAL EXAM:    Most Recent Vitals       Office Visit from 11/09/2016 in [place]    Temperature 36.7 C (98 F) filed at... 11/09/2016 0904    Heart Rate 86 filed at... 11/09/2016 0904    Respiratory Rate     BP (Non-Invasive) 133/85 filed at... 11/09/2016 0904    Height 1.753 m (5\' 9" ) filed at... 11/09/2016 0904    Weight 127.4 kg (280 lb 13.9 oz) filed at... 11/09/2016 0904    BMI (Calculated) 41.56 filed at... 11/09/2016 0904    BSA (Calculated) 2.49 filed at... 11/09/2016 0904        Gen: Well, NAD, nonseptic   HEENT: NC/AT, sclera anicteric, mucosa moist and pink   Neck: supple, nl ROM   Resp: Non labored respirations on RA  MSK: His R upper back incision remains well approximated with the exception of 0.5cm circular opening medially which is 2.5 cm deep (0.8 cm opening that was 6 cm deep last visit).  Serous drainage.   No erythema or  tenderness.    Lymph: no axillary, cervical or supraclavicular adenopathy bilaterally.   Neuro: A&Ox3, CN II-XII grossly intact.   Skin: Warm, dry.   Psych: Normal mood, affect and behavior. Patient was cooperative with the exam.        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 Open area at mid point of incision post melanoma excision and closure with serous drainage.  The opening is 0.5 cm that is 2.5 cm deep.  This is significantly improved from last visit.   No s/s of infection today.    2. Wound care - continue to cleanse daily and pack with ribbon gauze.    3. Moisturize skin to keep well conditioned.  Protect from sun.  4. RTC June 2018.  Call sooner with any problems or concerns.    [name] was given the chance to ask questions, and these were answered to their satisfaction. The patient is welcome to call with any questions or concerns in the meantime.     This patient was seen independently with Dr. Blanchie Dessert, cosigning physician, available for consultation.     Graylon Good, APRN,FNP-BC  11/09/2016, 09:25  [facility] of Reconstructive, Plastic and Hand Surgery

## 2016-11-18 ENCOUNTER — Telehealth (INDEPENDENT_AMBULATORY_CARE_PROVIDER_SITE_OTHER): Payer: Self-pay | Admitting: Physician Assistant

## 2016-11-18 NOTE — Telephone Encounter (Addendum)
MELANOMA DECISION DX TELEPHONE CONVERSATION DOCUMENTATION:    CLASS I: I have spoken with the patient today regarding the results of their report from recent Melanoma Decision Dx Testing.  The report indicates that he is a Class 1A, which equates to a 3-6% chance of recurrence, either local or distant, in the next 5 years.  Melanoma-specific survival at five-years is listed at 100%.   Given these results, the patient is very low risk for recurrence.  We shall continue with routine surveillance clinical examination follow-ups every 6 months for the first two years after surgery.  Routine dermatologic whole-body skin screening is still required at least twice yearly.  The patient is in agreement with this plan.    I explained the implications of these results and the need for any further testing or treatment, if necessary.  The patient had the opportunity to ask any and all questions, which were answered to best of my ability.  They know to contact my office with any further questions or concerns.    [address] PA-C 11/18/2016, 13:02  Physician Assistant Certified  [facility]

## 2016-11-24 ENCOUNTER — Encounter (INDEPENDENT_AMBULATORY_CARE_PROVIDER_SITE_OTHER): Payer: Self-pay | Admitting: Neurology

## 2016-11-24 ENCOUNTER — Ambulatory Visit: Payer: BC Managed Care – PPO | Attending: Neurology | Admitting: Neurology

## 2016-11-24 VITALS — BP 110/84 | HR 87 | Ht 69.0 in | Wt 278.4 lb

## 2016-11-24 DIAGNOSIS — Z9889 Other specified postprocedural states: Secondary | ICD-10-CM | POA: Insufficient documentation

## 2016-11-24 DIAGNOSIS — Z79899 Other long term (current) drug therapy: Secondary | ICD-10-CM | POA: Insufficient documentation

## 2016-11-24 DIAGNOSIS — Z6841 Body Mass Index (BMI) 40.0 and over, adult: Secondary | ICD-10-CM

## 2016-11-24 DIAGNOSIS — G1220 Motor neuron disease, unspecified: Secondary | ICD-10-CM | POA: Insufficient documentation

## 2016-11-24 NOTE — Progress Notes (Signed)
NAME:  Victor Frazier  DOB:  1981/05/29  VISIT DATE: 11/24/2016    CC:    Chief Complaint   Patient presents with   . Facial Pain     History obtained from the patient and chart/records, and review of last neurology note (this information was needed to manage patient today)  Age of patient:  36 y.o.      Progress note:  I had the pleasure of seeing your patient for a return visit today in the neuromuscular clinic, who is a 36 y.o. year old male with weakness in the lower extremities and high CK. He was last seen by me on 3/21 and now returns for follow up.    Since last being seen, there has not been much change. Still has weakness in the legs (left >> right), arms are completely normal. Stuill has trouble going up a flight of stairs, balance is slightly affected. No falls. Occasional muscle cramps in the calves. Muscle twitching is not as prominent as it was before, though does get prominent with exertion. To note, all these symptoms have been going on for more than 3 years.  Swallowing and speech are normal, no sob.   Does report some change in his emotions, tends to cry easily, which is new for him.  No change in his sexual drive, no gynecomastia. He does report he follows closely with urology, was thought to have some possible calcification in his testicles and gets ultrasound on an annual basis.   Had a melanoma surgery on March 30th.     As you know, his L spine MRI  in late 2016 showed minimal DDD at L5-S1, no significant N/f narrowing or foraminal stenosis.   CK's have been 500-700 range. His NCS/EMG done by Dr. Tamala Julian showed fibrillation potentials and PSW's in all the tested left lower extremity muscles. MUAP's were of increased amplitude in TA and vastus medialis and a few polyphasic MUAP's were noted in gastrocnemius. Needle exam from left UE and thoracic paraspinal muscles were normal.   He had a muscle biopsy, which was reported to show chronic neurogenic atrophy.      PMHx  Patient Active Problem List    Diagnosis   . Allergic rhinitis, cause unspecified   . Sinus pressure   . Facial pain   . Chronic left maxillary sinusitis   . Malignant melanoma of upper back [facility])     Current Outpatient Prescriptions   Medication Sig Dispense Refill   . fexofenadine (ALLEGRA) 180 mg Oral Tablet Take 180 mg by mouth Once a day     . Ibuprofen (MOTRIN) 200 mg Oral Tablet Take 200 mg by mouth Four times a day as needed for Pain     . metoprolol (LOPRESSOR) 25 mg Oral Tablet Take 25 mg by mouth Every evening        No current facility-administered medications for this visit.      No Known Allergies      ROS  Review of systems was positive for symptoms noted above.   All other systems have been reviewed and are negative except as per the HPI.    EXAMINATION  BP 110/84  Pulse 87  Ht 1.753 m (5\' 9" )  Wt 126.3 kg (278 lb 7.1 oz)  BMI 41.12 kg/m2    On general exam, patient was not in any acute distress, sitting comfortably in chair.   HEENT was unremarkable, neck was supple, no lymphadenopathy.   Extremities were unremarkable without cyanosis or  edema.    On neurological exam, patient was awake, alert and oriented to time, place and person.   Speech was fluent, without dysarthria. No aphasia.  Cranial nerve exam revealed normal pupils.    Extraocular movements were intact without diplopia or nystagmus. There was no ptosis at baseline or sustained upward gaze. Eye closure muscles did not show any weakness.   Face was symmetric without weakness. Patient was able to blow the cheeks well and push the tongue in the cheeks well.   Tongue was midline without atrophy or fasciculations.     Motor exam reveled decreased bulk in the left gastrocnemius muscle and slightly in left quadriceps muscle.   I did appreciate fasciculations in the b/l quadriceps muscles which were not frequent. A single twitch was noted in the right bicep. Muscle strength was normal in neck flexor and extensor muscles and all muscle groups in both UE's.    Strength in  the lower extremities was as follows (right/left): Hip flexors 5/4+, knee extensors 5/4+, Knee flexors 5/5-, ankle dorsiflexors 5/5- and plantar flexors 5/4+, evertors 5/4+ and invertors 5/5.  Reflexes were graded as follows (right/left): Biceps 2+/2+, triceps 2+/2+, Supinator 3/3, knee 3+/3+, ankles 2/2.  There was no hoffman's and no jaw jerk. There was no ankle clonus.  Sensory exam was normal to pin prick, temperature, vibration and proprioception in both upper and lower extremities.       Assessment and Plan:    ICD-10-CM    1. 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 This would be supported by slowly progressive weakness in the lower extremities (left > right),  high CK, neurogenic changes noted on both his EMG and muscle biopsy, normal sensory exam and sensory responses on his NCS and lack of any structural findings on imaging. His disease process has been slow, and has not progressed to involve the other levels over the last 3 years, which would speak against the classic ALS. I explained this to him at length. He does not gynecomastia, testicular atrophy as we would see in kennedy's disease and sensory responses were normal on his NCS.   At this time, we would like to continue to monitor him clinically. He will return in 3-4 months to see Dr. Tamala Julian (as I am leaving the institution) and we might repeat his EMG/NCS at the time, to check for evolution of the LMN process.   He is agreeable with the plan.   Continue home meds except as noted above.    Casandra Doffing, MD  11/24/2016, 08:16  AssociateProfessor, [facility]  Neuromuscular Medicine

## 2016-11-30 ENCOUNTER — Ambulatory Visit
Payer: BC Managed Care – PPO | Attending: DERMATOLOGY | Admitting: Student in an Organized Health Care Education/Training Program

## 2016-11-30 VITALS — Temp 98.1°F | Ht 69.29 in | Wt 280.6 lb

## 2016-11-30 DIAGNOSIS — Z08 Encounter for follow-up examination after completed treatment for malignant neoplasm: Secondary | ICD-10-CM | POA: Insufficient documentation

## 2016-11-30 DIAGNOSIS — Z85820 Personal history of malignant melanoma of skin: Secondary | ICD-10-CM | POA: Insufficient documentation

## 2016-11-30 DIAGNOSIS — C439 Malignant melanoma of skin, unspecified: Secondary | ICD-10-CM

## 2016-11-30 DIAGNOSIS — Z6841 Body Mass Index (BMI) 40.0 and over, adult: Secondary | ICD-10-CM

## 2016-11-30 NOTE — Progress Notes (Deleted)
Subjective:       Patient ID: Victor Frazier is a 36 y.o. male     Chief Complaint:     No chief complaint on file.       HPI   36 y.o. white male with no history of skin cancer and unknown family history of skin cancer presents for irritated lesion on his back. He thinks the mole has gotten slightly larger. Has not bled. He notes he still has weakness in his left leg and is following now with [facility] rheumatology and has appt with neurology to do EMG on his normal leg. OTherwise  denies any new, changing, bleeding, or rapidly growing lesions and has no other skin-related complaints.    Review of Systems   Constitutional: Negative for chills and fever.   Skin: Negative for rash and wound.     Current Outpatient Prescriptions   Medication Sig   . amoxicillin-pot clavulanate (AUGMENTIN) 875-125 mg Oral Tablet    . fexofenadine (ALLEGRA) 180 mg Oral Tablet Take 180 mg by mouth Once a day   . Ibuprofen (MOTRIN) 200 mg Oral Tablet Take 200 mg by mouth Four times a day as needed for Pain   . metoprolol (LOPRESSOR) 25 mg Oral Tablet Take 25 mg by mouth Every evening        Objective:   Marland Kitchen   Vitals:    11/30/16 1028   Temp: 36.7 C (98.1 F)   TempSrc: Thermal Scan   Weight: 127.3 kg (280 lb 10.3 oz)   Height: 1.76 m (5' 9.29")       Physical Exam   Constitutional: He appears well-developed and well-nourished. No distress.   Skin: Skin is warm and dry.          General skin exam was performed including head, neck, anterior and posterior trunk, bilateral upper, and lower extremities and revealed no areas of concern other than those documented.        Assessment & Plan:     Neoplasm of uncertain behavior most likely benign nevus vs irritated nevus vs r/o melanoma (#1)  - TIME OUT: A time out was performed to confirm the correct patient, procedure, and site. Consent obtained, area cleaned, and anesthetized. A shave biopsy was performed.  Aluminum chloride was used for hemostasis. Vaseline and bandage were placed over the wound  and wound care instructions were given. Patient demonstrated understanding of the instructions. Patient was advised that it would take approximately 2-3 weeks for the pathology results to be available and that I will personally contact the patient at the number provided to further discuss the pathology results.    Localized polymyositis   - Following with rheumatology.  - No rash today or ever.  - Will call if develops any skin symptoms.    Mannie Stabile, MD  08/16/2016, 15:31    See resident's note for details. I saw and examined the patient and agree with the resident's findings and plan as written except as noted and I was present and supervised/observed the entire procedure.    Bonnetta Barry, MD        Review of Systems   Constitutional: Negative for chills and fever.   Skin: Negative for rash and wound.     Physical Exam   Constitutional: He appears well-developed and well-nourished. No distress.   Skin: Skin is warm and dry.

## 2016-11-30 NOTE — Progress Notes (Signed)
Dermatology Clinic, [facility]  [place] [id number]  [phone]    Date:   11/30/2016  Name: Sheryl Towell  Age: 36 y.o.    Chief complaint: Skin Cancer    HPI  36yo male with recent melanoma. 1.82mm Breslow depth, negative sentinel lymph nodes, decision DX class 1a with low risk for recurrence. Melanoma excised March 30. Course has been complicated by wound dehiscence. Has been packing and gradually reducing in size. Almost totally healed now. Wearing SPF 70 most of the time but got burnt yesterday. Has not noticed any spots that are changing, bleeding, itching.      Review of Systems   Constitutional: Negative for chills and fever.   Skin: Negative for rash.     Current Medications  . amoxicillin-pot clavulanate (AUGMENTIN) 875-125 mg Oral Tablet    . fexofenadine (ALLEGRA) 180 mg Oral Tablet Take 180 mg by mouth Once a day   . Ibuprofen (MOTRIN) 200 mg Oral Tablet Take 200 mg by mouth Four times a day as needed for Pain   . metoprolol (LOPRESSOR) 25 mg Oral Tablet Take 25 mg by mouth Every evening      No Known Allergies  Past Medical History:   Diagnosis Date   . Cancer ([place])     melanoma on his back   . Heartburn     diet controlled   . History of anesthesia complications     patient adopted   . HTN (hypertension)    . 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 Wears glasses     reading         Physical Exam  Vitals: Temperature 36.7 C (98.1 F), temperature source Thermal Scan, height 1.76 m (5' 9.29"), weight 127.3 kg (280 lb 10.3 oz).  Physical Exam   Constitutional: He is oriented to person, place, and time. He appears well-developed and well-nourished. No distress.   HENT:   Head: Normocephalic and atraumatic.   Right Ear: External ear normal.   Left Ear: External ear normal.   Mouth/Throat: Oropharynx is clear and moist. No oropharyngeal exudate.   Eyes: Conjunctivae and EOM are normal. Right eye exhibits no discharge. Left eye exhibits no  discharge.   Pulm:  Effort normal.  Observations:  no respiratory distress.    Ortho/Musculoskeletal:   He exhibits no edema and no deformity.   Neurological: He is alert and oriented to person, place, and time. No cranial nerve deficit.   Skin: Skin is warm and dry.        Psychiatric: He has a normal mood and affect. His behavior is normal.     Assessment and Plan  Problem List Items Addressed This Visit     None      Visit Diagnoses     Malignant melanoma, unspecified site [facility])    -  Primary          Melanoma  -s/p excision March 30 complicated by wound dehiscence  -will continue to check every 3 months  -pictures taken today for monitoring purposes   -no evidence of recurrence  -discussed judicious use of sunscreen     Tamala Julian, MD    I saw and examined the patient.  I reviewed the resident's note.  I agree with the findings and plan of care as documented in the resident's note.  Any exceptions/additions are edited/noted.    Mauri Reading, MD

## 2016-12-21 ENCOUNTER — Encounter (HOSPITAL_BASED_OUTPATIENT_CLINIC_OR_DEPARTMENT_OTHER): Payer: Self-pay | Admitting: Nurse Practitioner

## 2016-12-21 ENCOUNTER — Ambulatory Visit: Payer: BC Managed Care – PPO | Attending: Nurse Practitioner | Admitting: Nurse Practitioner

## 2016-12-21 VITALS — BP 116/84 | HR 73 | Temp 98.0°F | Ht 69.0 in | Wt 274.7 lb

## 2016-12-21 DIAGNOSIS — I10 Essential (primary) hypertension: Secondary | ICD-10-CM | POA: Insufficient documentation

## 2016-12-21 DIAGNOSIS — Z6841 Body Mass Index (BMI) 40.0 and over, adult: Secondary | ICD-10-CM | POA: Insufficient documentation

## 2016-12-21 DIAGNOSIS — Z79899 Other long term (current) drug therapy: Secondary | ICD-10-CM | POA: Insufficient documentation

## 2016-12-21 DIAGNOSIS — Z85820 Personal history of malignant melanoma of skin: Secondary | ICD-10-CM | POA: Insufficient documentation

## 2016-12-21 DIAGNOSIS — Z48817 Encounter for surgical aftercare following surgery on the skin and subcutaneous tissue: Secondary | ICD-10-CM | POA: Insufficient documentation

## 2016-12-21 DIAGNOSIS — C4359 Malignant melanoma of other part of trunk: Secondary | ICD-10-CM

## 2016-12-21 DIAGNOSIS — Z87891 Personal history of nicotine dependence: Secondary | ICD-10-CM | POA: Insufficient documentation

## 2016-12-21 NOTE — Progress Notes (Signed)
Victor Frazier  [id number]  03-15-1981  12/21/2016      PROGRESS NOTE      REFERRING PROVIDER:  Illene Silver, MD  [address]  [place], [place] [address]      Chief Complaint   Patient presents with   . Wound Check     upper back       HISTORY OF PRESENT ILLNESS:  Victor Frazier is a 36 y.o. male who presents to [facility] today alone for POST OP evaluation of "WLE of back melanoma and intermediate closure on 10/01/16".  He subsequently developed an opening at the midpoint of the incision that was draining last visit.  He states "That part closed up around [place] day."  He denies any new open areas.  He has no drainage.  No pain.  He has been wearing sunscreen, and he has continued follow ups scheduled with Dr. Huel Cote. The patient denies any unusual fatigue, malaise, swelling, CP, or SOB.       History:    Past Medical History:   Diagnosis Date   . Cancer ([place])     melanoma on his back   . Heartburn     diet controlled   . History of anesthesia complications     patient adopted   . HTN (hypertension)    . 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 Wears glasses     reading       Past Surgical History:   Procedure Laterality Date   . EXCISION BENIGN SKIN LESION TRUNK / ARM / LEG      birthmark off left upper back   . HX LAP CHOLECYSTECTOMY     . HX TONSILLECTOMY     . ORBITAL RECONSTRUCTION Left    . SKIN CANCER EXCISION N/A 10/01/2016    Upper back melanoma       Social History     Social History   . Marital status: Single     Spouse name: N/A   . Number of children: N/A   . Years of education: N/A     Occupational History   . Not on file.     Social History Main Topics   . Smoking status: Former Smoker     Start date: 08/20/2011   . Smokeless tobacco: Former Systems developer      Comment: quit chewing 3 years ago   . Alcohol use Yes      Comment: a couple of shot os whiskey on the weekends   . Drug use: No   . Sexual activity: Not on file     Other Topics Concern   . Ability To Walk 1  Flight Of Steps Without Sob/Cp Yes   . Ability To Walk 2 Flight Of Steps Without Sob/Cp Yes   . Total Care No   . Ability To Do Own Adl's Yes   . Uses Walker No   . Uses Cane No     Social History Narrative       Social History     Social History Narrative       History   Drug Use No       Family Medical History     Problem Relation (Age of Onset)    No Known Problems Other          OB History   No data available       Current Outpatient Prescriptions  Medication Sig   . fexofenadine (ALLEGRA) 180 mg Oral Tablet Take 180 mg by mouth Once a day   . Ibuprofen (MOTRIN) 200 mg Oral Tablet Take 200 mg by mouth Four times a day as needed for Pain   . metoprolol (LOPRESSOR) 25 mg Oral Tablet Take 25 mg by mouth Every evening        No Known Allergies      REVIEW OF SYSTEMS: + as per HPI. Denies any headache, dizziness, nasal congestion, sore throat, chest pain, dyspnea, nausea, vomiting, abdomnial pain, diarrhea, constipation, seizures, and ataxia. All other systems were reviewed and are negative.    PHYSICAL EXAM:    Most Recent Vitals       Office Visit from 12/21/2016 in [facility], [place]    Temperature 36.7 C (98 F) filed at... 12/21/2016 0907    Heart Rate 73 filed at... 12/21/2016 0907    Respiratory Rate     BP (Non-Invasive) 116/84 filed at... 12/21/2016 0907    Height 1.753 m (5\' 9" ) filed at... 12/21/2016 0907    Weight 124.6 kg (274 lb 11.1 oz) filed at... 12/21/2016 0907    BMI (Calculated) 40.65 filed at... 12/21/2016 0907    BSA (Calculated) 2.46 filed at... 12/21/2016 0907         Gen: Well, NAD, nonseptic   HEENT: NC/AT, sclera anicteric, mucosa moist and pink   Neck: supple, nl ROM   Resp: Non labored respirations on RA  MSK: His R upper back incision is well healed/well approximated.  No open areas or drainage.   No erythema or tenderness.    Lymph: no axillary, cervical or supraclavicular adenopathy bilaterally.   Neuro: A&Ox3, CN II-XII grossly intact.   Skin: Warm, dry.   Psych:  Normal mood, affect and behavior. Patient was cooperative with the exam.        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 The patient is well healed s/p excision of upper back melanoma.  Small open area that remained last visit is healed.   No open areas, no erythema, no drainage.  He has full arm ROM.  No pain.   2. Moisturize skin to keep well conditioned.  Continue to be diligent to protect from sun.  3. Post op pictures taken today.  4. RTC PRN.  Continue follow ups as scheduled with Dr. Huel Cote for skin surveillance.  Call sooner with any problems or concerns.    Victor Frazier was given the chance to ask questions, and these were answered to their satisfaction. The patient is welcome to call with any questions or concerns in the meantime.     This patient was seen independently with Dr. Blanchie Dessert, cosigning physician, available for consultation.     Graylon Good, APRN,FNP-BC  12/21/2016, 09:23  [facility] Department of Reconstructive, Plastic and Hand Surgery

## 2017-02-10 ENCOUNTER — Encounter (HOSPITAL_BASED_OUTPATIENT_CLINIC_OR_DEPARTMENT_OTHER): Payer: BC Managed Care – PPO | Admitting: Rheumatology

## 2017-02-21 ENCOUNTER — Encounter (INDEPENDENT_AMBULATORY_CARE_PROVIDER_SITE_OTHER): Payer: Self-pay | Admitting: NEUROLOGY

## 2017-02-21 ENCOUNTER — Ambulatory Visit: Payer: BC Managed Care – PPO | Attending: NEUROLOGY | Admitting: NEUROLOGY

## 2017-02-21 VITALS — BP 146/96 | HR 98 | Ht 69.0 in | Wt 288.8 lb

## 2017-02-21 DIAGNOSIS — G1220 Motor neuron disease, unspecified: Secondary | ICD-10-CM | POA: Insufficient documentation

## 2017-02-21 DIAGNOSIS — Z79899 Other long term (current) drug therapy: Secondary | ICD-10-CM | POA: Insufficient documentation

## 2017-02-21 DIAGNOSIS — Z85820 Personal history of malignant melanoma of skin: Secondary | ICD-10-CM | POA: Insufficient documentation

## 2017-02-21 DIAGNOSIS — Z6841 Body Mass Index (BMI) 40.0 and over, adult: Secondary | ICD-10-CM

## 2017-02-21 DIAGNOSIS — J309 Allergic rhinitis, unspecified: Secondary | ICD-10-CM | POA: Insufficient documentation

## 2017-02-21 NOTE — Progress Notes (Addendum)
NAME:  Victor Frazier  DOB:  07/15/1980  VISIT DATE: 02/21/2017    CC:  Motor neuron disease  Chief Complaint   Patient presents with   . Other     History obtained from the patient and chart/records, and review of last neurology note (this information was needed to manage patient today)  Age of patient:  36 y.o.  Handedness: right    Progress note:   This is 36 y.o M who presents for follow up for possible lower motor neuron disease. Last seen by Dr. Denton Ar in 11/24/16.    Patient reports that he is doing much better compared to last visit. He is working out now and [employer] with significant improvement of his LLE weakness. He states that symptoms started 3.5 years ago but he was having trouble climbing up stairs since he was 36 y.o and he always attributed that to left knee issues. He used to have numbness and pain in his right anterior left thigh and left calf but not anymore recently.  No arms involvement and no tingling or numbness. He broke his right toes in recent vacation last week. He is not aware of similar family symptoms as he is adopted, not married, and have no kids.     PMHx  Patient Active Problem List   Diagnosis   . Allergic rhinitis, cause unspecified   . Sinus pressure   . Facial pain   . Chronic left maxillary sinusitis   . Malignant melanoma of upper back (CMS [facility])     Current Outpatient Prescriptions   Medication Sig Dispense Refill   . fexofenadine (ALLEGRA) 180 mg Oral Tablet Take 180 mg by mouth Once a day     . Ibuprofen (MOTRIN) 200 mg Oral Tablet Take 200 mg by mouth Four times a day as needed for Pain     . metoprolol (LOPRESSOR) 25 mg Oral Tablet Take 25 mg by mouth Every evening        No current facility-administered medications for this visit.      No Known Allergies      ROS  All other systems have been reviewed and are negative except as per the HPI.    EXAMINATION  BP (!) 146/96  Pulse 98  Ht 1.753 m (5\' 9" )  Wt 131 kg (288 lb 12.8 oz)  BMI 42.65 kg/m2       General/Appearance - Alert.  No acute distress.  Well kempt  HENT - Head atraumatic.    Fundi - Normal.  Discs are flat.  Cardiovascular - RRR.   No murmur.  Bilateral radial and pedal pulses 2+  Chest - Clear to auscultation.  Good cough.    Mental Status:  Orientation - Alert. Oriented x3  Attention - Normal  Memory - registration 3/3, recall 3/3, objects after 5 min.  Knowledge - normal  Language - normal  Speech - Normal.    Cranial Nerves:   2: No visual defect to confrontation.  PERRLA   3,4,6:  EOMI, no nystagmus   5:  Facial sensation intact to touch and pinprick in V1-V3.   7:  Face symmetric,  No facial weakness.   8: Hearing intact.  Finger rubbing heard bilaterally.   9,10:  Palate movement symmetric, uvula midline   11:  Sternocleidomastoid and trapezius muscles have full bulk and strength   12:  Tongue midline.  No fasciculations.  Good strength. No atrophy.    Gait: - Normal.  Not able to walk  on toes, can walk on heels and in tandem    Coordination:  No tremor.  No ataxia or dysmetria on finger to nose or heel to shin.  RAM intact.  Romberg sign was negative.    Sensory - normal except as noted below   Light Touch   Pin   Vibration   Position    Diabetic foot exam: not diabetic     Tone:  Normal    Motor - Normal strength (5/5) throughout except as noted:  Upper Ext Right  Left   Deltoid     Biceps     Triceps     Wrist ext     Wrist Flex     Finger ext     Finger flex     FDI     APB     ADM          Lower Ext     Gluteii     Iliopsoas     Quadriceps     Hamstrings     Ab/Adductors     Ant tibial  5-   Gastrocnemii     Post tibial (inversion)     Peronei (eversion)  4+   Hallux dorsiflexion     Hallux plantarflexion       Neck Flex    Neck Ext      Reflexes - 2+ throughout and no Babinski signs unless noted below:   RJ BJ TJ KJ AJ Plantars Hoffman's   Right          Left            Personal review of images, Tracings, specimens, outside records:    Labs:    09-22-16: CK 605       [facility] myomarker 3  panel negative  08-30-16: BMP WNL  06-01-16:  CBC diff, LFTs WNL    ANA negative    CK 775, aldolase 12  02-17-15: CK 696, aldolase 8.2    Studies:   NCS/EMG (08-04-16, C. Landy Dunnavant): This is an abnormal study. Nerve conduction studies of the left upper and lower extremity was normal. Needle examination of the left upper extremity (i.e. deltoid, biceps, and FDI ) in thoracic paraspinals was normal. Insertional activity was increasing the relations were observed in all or extremity muscle tested. An isolated fragmented mitotic discharge was observed in the medial gastrocnemius on needle insertion. MUAPs were large and the left vastus lateralis and tibialis anterior. A few polyphasic motor unit action potentials were observed the left medial gastrocnemius. The findings suggest a neurogenic process with myopathic remodeling of the left lower extremity.     Muscle biopsy (09-09-16): Chronic neurogenic atrophy. 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 Examples of both denervation fiber type grouping and chronic denervation are present. The increase in internally placed nuclei, split fiber and world fiber likely represent secondary myopathic features. Clinical correlation is needed. A single outside slide with H&E stained cryosections is reviewed. Present sections are cut at [place] stained with H&E, NADH, SDH, CCO, and try crown. Together, they reveal skeletal muscle with a wide variation in fiber size due to the presence of group atrophic and hypertrophic fibers. Some atrophic fibers are angulated. Pyknotic nuclear clusters are present. The immunoperoxidase stain for small and fast myosin heavy chains demonstrate large groups of each fiber type. No target fibers are seen. There is no inflammation, myonecrosis, regeneration or endomysial fibrosis. Fatty replacement is moderately severe. Internally placed nuclei are numerous. Split  fiber in one hypertrophic world fiber present. No inclusion bodies,  vacuoles or ragged red fibers are identified.     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     LLE weakness:   - patient continues to improve, neurological exam stable/imporved   - EMG/NCS showed myopathy with neurogenic changes and the muscle biopsy showed neurogenic atrophy with myopathic features   - differential includes: Spinal muscle atrophy (SMA) type 4 given onset of symptoms in his 20's. Differential includes: Merrilyn Puma disease (less likely given no sensory symptoms or sensory changes on EMG) and will continue to monitor if patient develop any clinical signs of ALS   - will test for SMA through [facility] free testing program     Return to clinic in 6 months   Continue home meds except as noted above.  Bonita Quin, MD  02/21/2017, 11:23  Total face-to-face time by staff: 25  minutes.  Total counseling/coordination of care time by staff: 15  minutes regarding:  Issues noted above plus bllod draw for SMA genetic testing      I saw and examined the patient.  I reviewed the resident's note.  I agree with the findings and plan of care as documented in the resident's note.  Any exceptions/additions are edited/noted.    Leigh Aurora, MD, PhD  Assistant Professor, [facility] Neurology  Neuromuscular Medicine    02-22-17: [facility] SMA 1/2 testing showed no pathologic variants or deletions/duplications.     Leigh Aurora, MD, PhD  02/28/2017, 09:39

## 2017-02-28 ENCOUNTER — Encounter (INDEPENDENT_AMBULATORY_CARE_PROVIDER_SITE_OTHER): Payer: Self-pay | Admitting: NEUROLOGY

## 2017-02-28 NOTE — Nursing Note (Signed)
[facility] Genetic Testing  [facility] Spinal Muscular Atrophy Panel  Negative Results. No pathogenic sequence variants or deletions/duplications identified.

## 2017-03-09 ENCOUNTER — Ambulatory Visit
Payer: BC Managed Care – PPO | Attending: DERMATOLOGY | Admitting: Student in an Organized Health Care Education/Training Program

## 2017-03-09 ENCOUNTER — Encounter (HOSPITAL_BASED_OUTPATIENT_CLINIC_OR_DEPARTMENT_OTHER): Payer: Self-pay | Admitting: Student in an Organized Health Care Education/Training Program

## 2017-03-09 VITALS — Temp 97.7°F | Ht 69.09 in | Wt 288.1 lb

## 2017-03-09 DIAGNOSIS — Z791 Long term (current) use of non-steroidal anti-inflammatories (NSAID): Secondary | ICD-10-CM | POA: Insufficient documentation

## 2017-03-09 DIAGNOSIS — Z6841 Body Mass Index (BMI) 40.0 and over, adult: Secondary | ICD-10-CM

## 2017-03-09 DIAGNOSIS — B079 Viral wart, unspecified: Secondary | ICD-10-CM | POA: Insufficient documentation

## 2017-03-09 DIAGNOSIS — Z79899 Other long term (current) drug therapy: Secondary | ICD-10-CM | POA: Insufficient documentation

## 2017-03-09 DIAGNOSIS — Z85820 Personal history of malignant melanoma of skin: Secondary | ICD-10-CM | POA: Insufficient documentation

## 2017-03-09 DIAGNOSIS — B078 Other viral warts: Secondary | ICD-10-CM

## 2017-03-09 DIAGNOSIS — I10 Essential (primary) hypertension: Secondary | ICD-10-CM | POA: Insufficient documentation

## 2017-03-10 NOTE — Progress Notes (Signed)
Dermatology Clinic, [facility]  [place] [address]  [phone]    Date:   03/09/2017  Name: Victor Frazier  Age: 36 y.o.    Chief complaint: Skin Check    HPI  36yo male with recent melanoma. 1.60mm Breslow depth, negative sentinel lymph nodes, decision DX class 1a with low risk for recurrence. Here for skin check. Pt complains of new wart on his left 2nd finger that is growing. Admits to picking at it. Otherwise  denies any new, changing, bleeding, or rapidly growing lesions and has no other skin-related complaints.      Review of Systems   Constitutional: Negative for chills and fever.   Skin: Negative for rash.     Current Medications   fexofenadine (ALLEGRA) 180 mg Oral Tablet Take 180 mg by mouth Once a day    Ibuprofen (MOTRIN) 200 mg Oral Tablet Take 200 mg by mouth Four times a day as needed for Pain    metoprolol (LOPRESSOR) 25 mg Oral Tablet Take 25 mg by mouth Every evening      No Known Allergies  Past Medical History:   Diagnosis Date    Cancer (CMS [place])     melanoma on his back    Heartburn     diet controlled    History of anesthesia complications     patient adopted    HTN (hypertension)     Malignant melanoma of upper back (CMS [facility])     Wears glasses     reading         Physical Exam  Vitals: Temperature 36.5 C (97.7 F), temperature source Thermal Scan, height 1.755 m (5' 9.09"), weight 130.7 kg (288 lb 2.3 oz).  Physical Exam   Constitutional: He appears well-developed and well-nourished. No distress.   Skin: Skin is warm and dry.        Psychiatric: He has a normal mood and affect. His behavior is normal.     Assessment and Plan    History of Melanoma (#1)  - No evidence of recurrence  - Recommended using sunscreen with SPF at least 30 and re-apply every 2-3 hours.  - ABCDE's of melanoma (Asymmetry, Border irregularity, Color variation, Diameter (>6 mm), and Evolution of lesion) were reviewed with the patient. Recommended monthly  self-evaluation, photoprotection, and return to clinic with any new or changing moles.  - RTC in 3 months    Verruca vulgaris (#2)  - Cryotherapy performed x 1 lesion. Expected side effects were discussed including pain, edema, vesicles, and pigment changes, especially hypopigmentation. The patient expressed understanding of expected side effects.       Mannie Stabile, MD  03/10/2017, 12:31  See resident's note for details. I saw and examined the patient and agree with the resident's findings and plan as written except as noted, and I was present and supervised/observed all procedures in their entirety.    Mauri Reading, MD 03/10/2017, 12:36

## 2017-04-05 ENCOUNTER — Ambulatory Visit
Payer: BC Managed Care – PPO | Attending: Dermatology | Admitting: Student in an Organized Health Care Education/Training Program

## 2017-04-05 VITALS — Temp 97.5°F | Ht 69.41 in | Wt 290.8 lb

## 2017-04-05 DIAGNOSIS — Z85820 Personal history of malignant melanoma of skin: Secondary | ICD-10-CM | POA: Insufficient documentation

## 2017-04-05 DIAGNOSIS — Z6841 Body Mass Index (BMI) 40.0 and over, adult: Secondary | ICD-10-CM

## 2017-04-05 DIAGNOSIS — Z79899 Other long term (current) drug therapy: Secondary | ICD-10-CM | POA: Insufficient documentation

## 2017-04-05 DIAGNOSIS — B078 Other viral warts: Secondary | ICD-10-CM

## 2017-04-05 DIAGNOSIS — I10 Essential (primary) hypertension: Secondary | ICD-10-CM | POA: Insufficient documentation

## 2017-04-05 DIAGNOSIS — B079 Viral wart, unspecified: Secondary | ICD-10-CM | POA: Insufficient documentation

## 2017-04-05 NOTE — Progress Notes (Addendum)
Dermatology Clinic, [facility]  [place] [address]  [phone]    Date:   04/05/2017  Name: Victor Frazier  Age: 35 y.o.    Chief complaint: Foot Verruca (left 2nd finger) and Wart    HPI  Victor Frazier is a 36 y.o. male with recent melanoma. 1.25mm Breslow depth, negative sentinel lymph nodes, decision DX class 1a with low risk for recurrence presenting for follow up of wart.  Patient states the wart on his left 2nd finger is persistent. He would like to continue treatment. Otherwise  denies any new, changing, bleeding, or rapidly growing lesions and has no other skin-related complaints.      Review of Systems   Constitutional: Negative for chills and fever.   Skin: Negative for rash.     Current Medications  . fexofenadine (ALLEGRA) 180 mg Oral Tablet Take 180 mg by mouth Once a day   . Ibuprofen (MOTRIN) 200 mg Oral Tablet Take 200 mg by mouth Four times a day as needed for Pain   . metoprolol (LOPRESSOR) 25 mg Oral Tablet Take 25 mg by mouth Every evening      No Known Allergies  Past Medical History:   Diagnosis Date   . Cancer (CMS [place])     melanoma on his back   . Heartburn     diet controlled   . History of anesthesia complications     patient adopted   . HTN (hypertension)    . Malignant melanoma of upper back (CMS [place])    . Wears glasses     reading         Physical Exam  Vitals: Temperature 36.4 C (97.5 F), temperature source Thermal Scan, height 1.763 m (5' 9.41"), weight 131.9 kg (290 lb 12.6 oz).  Physical Exam   Constitutional: He appears well-developed and well-nourished. No distress.   Skin: Skin is warm and dry.        Psychiatric: He has a normal mood and affect. His behavior is normal.     Assessment and Plan      Verruca vulgaris (#2)  - Cryotherapy performed x 1 lesion. Expected side effects were discussed including pain, edema, vesicles, and pigment changes, especially hypopigmentation. The patient expressed understanding of  expected side effects.  - Advised patient to start using wart stick nightly.     I am scribing for, and in the presence of, Dr. Raphael Gibney for services provided on 04/05/2017.  [employer], SCRIBE     I personally performed the services described in this documentation, as scribed  in my presence, and it is both accurate  and complete.    Mannie Stabile, MD      See resident's note for details. I saw and examined the patient and agree with the resident's findings and plan as written except as noted  and: I was present and supervised/observed the entire cryotherapy procedure.    Gordy Councilman, MD

## 2017-04-20 ENCOUNTER — Ambulatory Visit
Payer: BC Managed Care – PPO | Attending: DERMATOLOGY | Admitting: Student in an Organized Health Care Education/Training Program

## 2017-04-20 VITALS — Temp 96.6°F | Ht 70.04 in | Wt 294.1 lb

## 2017-04-20 DIAGNOSIS — B078 Other viral warts: Secondary | ICD-10-CM

## 2017-04-20 DIAGNOSIS — Z85820 Personal history of malignant melanoma of skin: Secondary | ICD-10-CM | POA: Insufficient documentation

## 2017-04-20 DIAGNOSIS — Z6841 Body Mass Index (BMI) 40.0 and over, adult: Secondary | ICD-10-CM

## 2017-04-20 DIAGNOSIS — I10 Essential (primary) hypertension: Secondary | ICD-10-CM | POA: Insufficient documentation

## 2017-04-20 DIAGNOSIS — Z79899 Other long term (current) drug therapy: Secondary | ICD-10-CM | POA: Insufficient documentation

## 2017-04-20 DIAGNOSIS — B079 Viral wart, unspecified: Secondary | ICD-10-CM | POA: Insufficient documentation

## 2017-04-20 NOTE — Progress Notes (Addendum)
Dermatology Clinic, [facility]  [place] [address]  [phone]    Date:   04/20/2017  Name: Victor Frazier  Age: 36 y.o.    Chief complaint: Skin Check    HPI  Victor Frazier is a 36 y.o. male with recent melanoma. 1.48mm Breslow depth, negative sentinel lymph nodes, decision DX class 1a with low risk for recurrence presenting for follow up of wart.  Patient states the wart on his left 2nd finger is persistent but is slightly improved. He notes that he does not blister that badly following treatment with cryo. He would like to continue treatment. Otherwise  denies any new, changing, bleeding, or rapidly growing lesions and has no other skin-related complaints.      Review of Systems   Constitutional: Negative for chills and fever.   Skin: Negative for rash.     Current Medications  . fexofenadine (ALLEGRA) 180 mg Oral Tablet Take 180 mg by mouth Once a day   . Ibuprofen (MOTRIN) 200 mg Oral Tablet Take 200 mg by mouth Four times a day as needed for Pain   . metoprolol (LOPRESSOR) 25 mg Oral Tablet Take 25 mg by mouth Every evening      No Known Allergies  Past Medical History:   Diagnosis Date   . Cancer (Victor Frazier)     melanoma on his back   . Heartburn     diet controlled   . History of anesthesia complications     patient adopted   . HTN (hypertension)    . Malignant melanoma of upper back (Victor Frazier)    . Wears glasses     reading         Physical Exam  Vitals: Temperature 35.9 C (96.6 F), temperature source Thermal Scan, height 1.779 m (5' 10.04"), weight 133.4 kg (294 lb 1.5 oz).  Physical Exam   Constitutional: He appears well-developed and well-nourished. No distress.   Skin: Skin is warm and dry.        Psychiatric: He has a normal mood and affect. His behavior is normal.     Assessment and Plan    Verruca vulgaris (#2)  - Improved  - Cryotherapy performed x 1 lesion. Expected side effects were discussed including pain, edema, vesicles, and pigment  changes, especially hypopigmentation. The patient expressed understanding of expected side effects.  - Advised patient to start using wart stick nightly.     History of melanoma  - Pt moving at end of November, cancelled follow up in December  - Pt advised to make appt with Dermatologist in [facility] for Decemeber, and if he has any trouble to contact our office    Mannie Stabile, MD  04/20/2017, 14:21    See resident's note for details. I saw and examined the patient and agree with the resident's findings and plan as written except as noted, and I was present and supervised/observed all procedures in their entirety.    Mauri Reading, MD 04/20/2017, 15:07

## 2017-04-26 ENCOUNTER — Encounter (INDEPENDENT_AMBULATORY_CARE_PROVIDER_SITE_OTHER): Payer: Self-pay | Admitting: NEUROLOGY

## 2017-04-27 NOTE — Telephone Encounter (Signed)
-----   Message from [employer]. [place] sent at 04/26/2017 11:21 PM EDT -----  Regarding: Referral Question  Contact: [phone]  Hello Dr Tamala Julian,     I am moving to [place], [place] and was wanting to see who you would refer me to down in that area. I have been looking myself and i know [facility] is fairly close also.  If you need to get in touch with me here is my Cell # [phone]    Thank You   Victor Frazier

## 2017-04-27 NOTE — Telephone Encounter (Signed)
Ok to refer? Anyone specific?

## 2017-05-02 ENCOUNTER — Other Ambulatory Visit (INDEPENDENT_AMBULATORY_CARE_PROVIDER_SITE_OTHER): Payer: Self-pay | Admitting: NEUROLOGY

## 2017-05-02 DIAGNOSIS — G5792 Unspecified mononeuropathy of left lower limb: Secondary | ICD-10-CM

## 2017-05-02 NOTE — Telephone Encounter (Signed)
-----   Message from [employer]. [place] sent at 05/02/2017 11:42 AM EDT -----  Regarding: RE: RE: Referral Question  Contact: [phone]  ok that sounds good. Will I need a referral from you to go there?    Thanks.     ----- Message -----  From: Leigh Aurora, MD  Sent: 05/02/17, 9:55 AM  To: Modena Jansky. [place]  Subject: RE: Referral Question    Sorry I have been out of the office.  [employer] if going to [employer].     Leigh Aurora, MD, PhD  05/02/2017, 09:54      ----- Message -----     From: Modena Jansky. [place]     Sent: 04/26/2017 11:21 PM EDT       To: Leigh Aurora, MD  Subject: Referral Question    Hello Dr Tamala Julian,     I am moving to [place], [place] and was wanting to see who you would refer me to down in that area. I have been looking myself and i know [facility] is fairly close also.  If you need to get in touch with me here is my Cell # [phone]    Thank You   Burke Keels

## 2017-05-03 NOTE — Telephone Encounter (Signed)
Called [facility] - [phone]  Will fax records to [place] [facility]- Attn-Dr.Bedlack at [phone]

## 2017-05-03 NOTE — Telephone Encounter (Signed)
done

## 2017-05-03 NOTE — Telephone Encounter (Signed)
Regarding: FW: RE: Referral Question  Referral order provided.     Leigh Aurora, MD, PhD  05/02/2017, 15:16    ----- Message -----     From: Abran [facility], RN     Sent: 05/02/2017  11:42 AM       To: Leigh Aurora, MD  Subject: RE: RE: Referral Question                        ----- Message from Abran [facility], RN sent at 05/02/2017 11:42 AM EDT -----       ----- Message from [name] to Leigh Aurora, MD sent at 05/02/2017 11:42 AM -----   ok that sounds good. Will I need a referral from you to go there?    Thanks.     ----- Message -----  From: Leigh Aurora, MD  Sent: 05/02/17, 9:55 AM  To: [name]  Subject: RE: Referral Question    Sorry I have been out of the office.  [employer] if going to [employer].     Leigh Aurora, MD, PhD  05/02/2017, 09:54      ----- Message -----     From: [name]     Sent: 04/26/2017 11:21 PM EDT       To: Leigh Aurora, MD  Subject: Referral Question    Hello Dr Tamala Julian,     I am moving to [place], [place] and was wanting to see who you would refer me to down in that area. I have been looking myself and i know [facility] is fairly close also.  If you need to get in touch with me here is my Cell # [phone]    Thank You   Victor Frazier

## 2017-05-03 NOTE — Telephone Encounter (Signed)
Regarding: FW: RE: Referral Question  Referral order provided.     Leigh Aurora, MD, PhD  05/02/2017, 15:16    ----- Message -----     From: Abran [facility], RN     Sent: 05/02/2017  11:42 AM       To: Leigh Aurora, MD  Subject: RE: RE: Referral Question                        ----- Message from Abran [facility], RN sent at 05/02/2017 11:42 AM EDT -----       ----- Message from [name] to Leigh Aurora, MD sent at 05/02/2017 11:42 AM -----   ok that sounds good. Will I need a referral from you to go there?    Thanks.     ----- Message -----  From: Leigh Aurora, MD  Sent: 05/02/17, 9:55 AM  To: Modena Jansky. [place]  Subject: RE: Referral Question    Sorry I have been out of the office.  [employer] if going to [employer].     Leigh Aurora, MD, PhD  05/02/2017, 09:54      ----- Message -----     From: Modena Jansky. [place]     Sent: 04/26/2017 11:21 PM EDT       To: Leigh Aurora, MD  Subject: Referral Question    Hello Dr Tamala Julian,     I am moving to [place], [place] and was wanting to see who you would refer me to down in that area. I have been looking myself and i know [facility] is fairly close also.  If you need to get in touch with me here is my Cell # [phone]    Thank You   Burke Keels

## 2017-05-16 ENCOUNTER — Ambulatory Visit: Payer: BC Managed Care – PPO | Attending: SURGICAL ONCOLOGY | Admitting: SURGICAL ONCOLOGY

## 2017-05-16 ENCOUNTER — Encounter (HOSPITAL_BASED_OUTPATIENT_CLINIC_OR_DEPARTMENT_OTHER): Payer: Self-pay | Admitting: SURGICAL ONCOLOGY

## 2017-05-16 VITALS — BP 143/94 | HR 102 | Temp 96.8°F | Resp 18 | Ht 69.0 in | Wt 294.3 lb

## 2017-05-16 DIAGNOSIS — Z6841 Body Mass Index (BMI) 40.0 and over, adult: Secondary | ICD-10-CM

## 2017-05-16 DIAGNOSIS — C4359 Malignant melanoma of other part of trunk: Secondary | ICD-10-CM

## 2017-05-16 DIAGNOSIS — Z79899 Other long term (current) drug therapy: Secondary | ICD-10-CM | POA: Insufficient documentation

## 2017-05-16 DIAGNOSIS — Z9889 Other specified postprocedural states: Secondary | ICD-10-CM | POA: Insufficient documentation

## 2017-05-16 DIAGNOSIS — Z08 Encounter for follow-up examination after completed treatment for malignant neoplasm: Secondary | ICD-10-CM | POA: Insufficient documentation

## 2017-05-16 NOTE — Cancer Center Note (Signed)
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    SURGICAL THERAPY:  1. WLE of right upper back and Right axillary SLNB (Dr. Huel Cote) 10/01/2016  2. Intermediate wound closure of the back (Dr. Liliane Bade) 10/01/2016    SYSTEMIC THERAPY: None    CLINICAL TRIAL: None    CHIEF COMPLAINT: Routine surveillance.    HISTORY OF PRESENT ILLNESS:  Victor Frazier is a 36 y.o. male who returns to clinic today for wound check following WLE of right upper back and SLNB by Dr. Huel Cote and complex wound closure by Dr. Liliane Bade on 10/01/2016. The patient was last seen in clinic in May 2018 due to postoperative wound complications that were addressed by plastic surgery. He has now healed and returns for surveillance. He reports he is finally getting some feeling back at the [facility] site. He denies any new/worrisome skin lesions, headaches, visual changes, unintentional weight loss, fever, chills, or palpable lymphadenopathy. He reports he will be moving to [place], [place] next week and is asking if there is any one in particular he should see there for melanoma surveillance.     REVIEW OF SYSTEMS:   Constitutional: pleasant, cooperative, denies fevers, chills or weight loss   HEENT: denies headache, dizziness, double vision, tinnitus, nasal congestion, or difficulty swallowing   Cardiovascular: denies chest pain, palpitations, dyspnea on exertion   Respiratory: denies SOB, cough   Gastrointestinal (GI): denies abdominal pain, nausea, vomiting, constipation, diarrhea   Genitourinary (GU): denies dysuria, hematuria   Musculoskeletal: denies weakness, joint pain   Endocrine: denies polydipsia, polyuria   Neurological: denies dizziness, facial droop, focal motor/sensory deficits   Psychiatric: denies  anxiety, depression, sleeping disturbances    PAST MEDICAL, PAST SURGICAL, FAMILY, AND SOCIAL HISTORY:  The patient denies any changes to this portion of his history since 11/08/16.     HOME MEDICATIONS:  Current Outpatient Prescriptions   Medication Sig   . fexofenadine (ALLEGRA) 180 mg Oral Tablet Take 180 mg by mouth Once a day   . Ibuprofen (MOTRIN) 200 mg Oral Tablet Take 200 mg by mouth Four times a day as needed for Pain   . metoprolol (LOPRESSOR) 25 mg Oral Tablet Take 25 mg by mouth Every evening        ALLERGIES:  No Known Allergies       PHYSICAL EXAMINATION:  ECOG Performance Status: 0 - Asymptomatic  Karnofsky Performance Status: 100% - normal, no complaints, no signs of disease  General: pleasant, well-developed, well-nourished    White  Unknown male who is in no acute distress.    Vital Signs: BP 110/80  Pulse 100  Temp 36.6 C (97.9 F) (Thermal Scan)   Ht 1.753 m (5\' 9" )  Wt 127.8 kg (281 lb 12 oz)  SpO2 93% Comment: room air  BMI 41.61 kg/m2  Lymphatics: no palpable lymphadenopathy of the cervical, supraclavicular, or axillary nodal basins bilaterally  Cardiovascular: regular rate and rhythm, S1, S2 normal, no murmur, click, rub or gallop  Pulmonary: normal effort, chest expands symmetrically, lungs are clear to auscultation bilaterally.   Abdomen: non-distended, normal bowel sounds, no bruits, soft, nontender, no organomegaly or masses  Extremities: Right axilla incision C/D/I with no signs of infection, No cyanosis, or edema. No  lymphedema of the RUE.  Back: WLE site of the right upper back well healed with evidence of prior dehiscence. No nodularity, abnormal pigmentation, or recurrent nevi. No clinical evidence of melanoma recurrence.  Neurologic: alert and oriented to person, place, time, and situation, cranial nerves grossly intact, no focal motor or sensory deficits  Psychiatric: speech pattern and movements are normal, normal mood, affect and judgment    LABORATORY REVIEW (I have  personally reviewed all recent laboratory studies):  None    IMAGING REVIEW (I have personally reviewed both the reports and images):  None    PATHOLOGY REVIEW:   1. 10/01/2016 from [facility]: Final Pathologic Diagnosis A. RIGHT AXILLARY SENTINEL LYMPH NODE: Lymph node, negative for malignancy by routine staining and immunostains for SOX10 and Melan A. B. SKIN, RIGHT UPPER BACK, EXCISION: Skin with biopsy site changes. No residual melanocytic proliferation identified.  2. 08/16/2016 from [facility] [place]: SKIN, RIGHT BACK, SHAVE BIOPSY:Malignant melanoma (see Cancer Case Summary).  CANCER CASE SUMMARY Procedure: Biopsy, shave. Specimen laterality: Right. Tumor site: Back.  Microscopic satellite nodules: None identified. Histologic type: Nevoid melanoma. Maximum tumor thickness: 1.1 mm. Ulceration: Not identified. Microsatellites: Not identified. Margins: Uninvolved. Melanoma in-situ comes to within <1 mm of a peripheral edge. Mitotic rate: 1/mm2. Lymphovascular invasion: Not identified. Neurotrophism: Not identified. Tumor infiltrating lymphocytes: Present, non-brisk. Tumor regression: Not identified. Pathologic stage: pT2a, Nx.    ASSESSMENT:  36 y.o.  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 No clinical evidence of melanoma recurrence.    PLAN:  1. The patient's physical exam was discussed with him in detail today. As stated above, he has no clinical evidence of melanoma recurrence.  2. Thus, per NCCN guidelines for stage IA - IIA patients, follow-up should consist of repeat clinical examination every 6 months for 2 years and then annually until 5 years.  In this cohort, routine radiologic imaging and laboratory screening for asymptomatic patients is not recommended. This was also influenced by his Melanoma Decision Dx testing results, showing that he is a low risk of recurrence and/or metastatic disease.  3. I have emphasized continued close dermatologic  surveillance with quarterly examinations, and monthly self-skin and lymph node exams. We reviewed the appropriate application of sunscreen and use of sun protective clothing.  4. I have discussed his comorbid conditions and how they may impact surgical healing or further therapy, including his return of sensation.  5. The patient is planning to move to [place], [place] next week. He was advised to establish with a PCP and request a dermatology referral from there office once there. He should see them twice yearly for surveillance. If records are needed, he will contact us so that we may send them. Thus, he will return to clinic PRN.  6. All of the patient's questions were answered to best of my ability and to his satisfaction. He seems to understand the disease process and the above mentioned treatment algorithm. After deliberation, he agrees to proceed as outlined. I have instructed him to call with new or worsening symptoms, questions, or concerns. The patient was seen in conjunction with cosigning faculty, Dr. Huel Cote, at the visit today.    [address] Thayer Ohm 05/16/2017, 10:33  Physician Assistant Certified  [place] [facility]    I saw and examined this patient with the physician assistant above.  Please see the physician assistant's note, which I have carefully reviewed, for full details. I agree  with the findings and plan of care as documented in the physician assistant's note.  Any additions/exceptions are edited/noted.    Doing very well.  No issues, has some return of sensation around that scar and feels well.  He is moving to [place], [place] next week for work (with [employer]).  On exam, the right upper back incision has healed well, although the scar is wide given that it opened and required packing.  The scar is soft, no nodules, no pigmentation, no adenopathy, right axillary incision intact without complication.  He will establish with a PCP and dermatologist in [place].  I would be  happy to fax any/all records to these offices in the future.  No scheduled f/u here in this office given his relocation to [place].    Illene Silver, MD 05/16/2017 11:19  Associate Professor of Surgery  Division of [facility]

## 2017-05-17 ENCOUNTER — Ambulatory Visit (HOSPITAL_BASED_OUTPATIENT_CLINIC_OR_DEPARTMENT_OTHER): Payer: BC Managed Care – PPO

## 2017-05-17 ENCOUNTER — Other Ambulatory Visit (HOSPITAL_BASED_OUTPATIENT_CLINIC_OR_DEPARTMENT_OTHER): Payer: Self-pay | Admitting: Dermatology

## 2017-05-17 ENCOUNTER — Ambulatory Visit
Payer: BC Managed Care – PPO | Attending: Dermatology | Admitting: Student in an Organized Health Care Education/Training Program

## 2017-05-17 VITALS — Temp 97.3°F | Ht 69.61 in | Wt 299.2 lb

## 2017-05-17 DIAGNOSIS — D235 Other benign neoplasm of skin of trunk: Secondary | ICD-10-CM

## 2017-05-17 DIAGNOSIS — Z85820 Personal history of malignant melanoma of skin: Secondary | ICD-10-CM

## 2017-05-17 DIAGNOSIS — D485 Neoplasm of uncertain behavior of skin: Secondary | ICD-10-CM

## 2017-05-17 DIAGNOSIS — B079 Viral wart, unspecified: Secondary | ICD-10-CM | POA: Insufficient documentation

## 2017-05-17 DIAGNOSIS — B078 Other viral warts: Secondary | ICD-10-CM

## 2017-05-17 DIAGNOSIS — Z6841 Body Mass Index (BMI) 40.0 and over, adult: Secondary | ICD-10-CM

## 2017-05-17 DIAGNOSIS — D487 Neoplasm of uncertain behavior of other specified sites: Secondary | ICD-10-CM | POA: Insufficient documentation

## 2017-05-17 NOTE — Progress Notes (Addendum)
Dermatology Clinic, [facility]  [place] [id number]  [phone]    Date:   05/17/2017  Name: Victor Frazier  Age: 36 y.o.    Chief complaint: Skin Check    HPI  Victor Frazier is a 36 y.o. male with recent melanoma (1.27mm Breslow depth, negative sentinel lymph nodes, decision DX class 1a with low risk for recurrence) presenting for a skin check. Patient has not noticed any new lesions. He states the wart on his left 2nd finger is persistent but is slightly improved. He would like to repeat treatment with cryotherapy today. He never started OTC wart stick. Otherwise  denies any new, changing, bleeding, or rapidly growing lesions and has no other skin-related complaints.      Review of Systems   Constitutional: Negative for chills and fever.   Skin: Negative for rash.     Current Medications  . fexofenadine (ALLEGRA) 180 mg Oral Tablet Take 180 mg by mouth Once a day   . Ibuprofen (MOTRIN) 200 mg Oral Tablet Take 200 mg by mouth Four times a day as needed for Pain   . metoprolol (LOPRESSOR) 25 mg Oral Tablet Take 25 mg by mouth Every evening      No Known Allergies  Past Medical History:   Diagnosis Date   . Cancer (CMS [place])     melanoma on his back   . Heartburn     diet controlled   . History of anesthesia complications     patient adopted   . HTN (hypertension)    . Malignant melanoma of upper back (CMS [place])    . Wears glasses     reading         Physical Exam  Vitals: Temperature 36.3 C (97.3 F), temperature source Thermal Scan, height 1.768 m (5' 9.61"), weight 135.7 kg (299 lb 2.6 oz).  Physical Exam   Constitutional: He appears well-developed and well-nourished. No distress.   Skin: Skin is warm and dry.        Psychiatric: He has a normal mood and affect. His behavior is normal.     General skin exam was performed including head, neck, anterior/posterior trunk, bilateral upper, and lower extremities and revealed no areas of concern other than those  documented.    Assessment and Plan    Neoplasm of uncertain behavior (#3 body)  -DDx includes atypical nevus rule out melanoma  - TIME OUT: A time out was performed to confirm the correct patient, procedure, and site. Consent obtained, area cleaned, and anesthetized. A shave biopsy was performed. Aluminum chloride was used for hemostasis. Vaseline and bandage were placed over the wound and wound care instructions were given. Patient demonstrated understanding of the instructions. Patient was advised that it would take approximately 2-3 weeks for the pathology results to be available and that I will personally contact the patient at the number provided to further discuss the pathology results.    Verruca vulgaris (#2)  - Improved  - Cryotherapy performed x 1 lesion. Expected side effects were discussed including pain, edema, vesicles, and pigment changes, especially hypopigmentation. The patient expressed understanding of expected side effects.  - Advised patient to start using wart stick nightly.     History of melanoma (#1)  - No evidence of recurrence. Follow.   - Patient is moving at end of November. He was advised to make an appointment with a Dermatologist in [place] for Feb., and if he has any  trouble to contact our office.    I am scribing for, and in the presence of, Dr. Raphael Gibney for services provided on 05/17/2017.  [employer], SCRIBE     I personally performed the services described in this documentation, as scribed  in my presence, and it is both accurate  and complete.    Mannie Stabile, MD          I saw and examined the patient.  I was present and supervised the entire procedure.  I reviewed the resident's note.  I agree with the findings and plan of care as documented in the resident's note.  Any exceptions/additions are edited/noted.    Dairl Ponder, MD 05/18/2017, 10:35

## 2017-05-17 NOTE — Nursing Note (Signed)
05/17/17 1100   Medication Administration   Initials crc   Medication  Lidocaine with Epi   Medication Dose 1cc   Route of Administration Intradermal   NDC # [id number]   LOT # [id number]   Expiration date 08/23/16   Manufacturer [employer]   Clinic Supplied Yes   Patient Supplied No   Comments: Admin by Dr. Raphael Gibney

## 2017-05-19 LAB — HISTORICAL SURGICAL PATHOLOGY SPECIMEN

## 2017-06-13 ENCOUNTER — Encounter (HOSPITAL_BASED_OUTPATIENT_CLINIC_OR_DEPARTMENT_OTHER): Payer: Self-pay | Admitting: Student in an Organized Health Care Education/Training Program

## 2017-08-09 ENCOUNTER — Other Ambulatory Visit: Payer: Self-pay

## 2017-08-09 ENCOUNTER — Ambulatory Visit: Payer: BLUE CROSS/BLUE SHIELD | Attending: Neurology | Admitting: Physical Therapy

## 2017-08-09 ENCOUNTER — Encounter: Payer: Self-pay | Admitting: Physical Therapy

## 2017-08-09 DIAGNOSIS — M6281 Muscle weakness (generalized): Secondary | ICD-10-CM | POA: Diagnosis present

## 2017-08-09 DIAGNOSIS — R278 Other lack of coordination: Secondary | ICD-10-CM | POA: Insufficient documentation

## 2017-08-09 DIAGNOSIS — R2689 Other abnormalities of gait and mobility: Secondary | ICD-10-CM | POA: Diagnosis present

## 2017-08-09 NOTE — Patient Instructions (Addendum)
     Bridge   Lie back, legs bent. Inhale, pressing hips up. Keeping ribs in, lengthen lower back. Exhale, rolling down along spine from top. Repeat __10__ times. Do __1__ sessions per day.             Lavinia SharpsStacy Venancio Chenier PT [facility] [address], [address] [place], [place] [address] Phone # [phone] Fax [phone]

## 2017-08-09 NOTE — Therapy (Signed)
[facility] [address], [address] [place], [place], [address] Phone: [phone]   Fax:  [phone]  Physical Therapy Evaluation  Patient Details  Name: Nathan LoftRobert Pierce MRN: [id number] Date of Birth: 01/18/1981 Referring Provider: Dr. Yvonna Alanisichard Bedlack   Encounter Date: 08/09/2017  PT End of Session - 08/09/17 1503    Visit Number  1    Date for PT Re-Evaluation  10/04/17    PT Start Time  1445    PT Stop Time  1530    PT Time Calculation (min)  45 min    Activity Tolerance  Patient tolerated treatment well       History reviewed. No pertinent past medical history.  History reviewed. No pertinent surgical history.  There were no vitals filed for this visit.   Subjective Assessment - 08/09/17 1447    Subjective  Pre-ALS;  The doctor wanted me to come and strengthen my core so I don't lose my balance as much;  Right LE weakness especially in quad and calf.  Unable to do reciprocal stair climbing one at a time with railings.  1-2 falls in past 6 months usually with drinking alcohol.      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  5-10 min then need a break    Diagnostic tests  muscle biopsy    Patient Stated Goals  Not fall    Currently in Pain?  No/denies    Pain Score  0-No pain    Pain Location  Knee    Pain Orientation  Right;Left    Pain Type  Chronic pain    Pain Frequency  Intermittent    Aggravating Factors   knee pain comes and goes         [facility] - 08/09/17 0001      Assessment   Medical Diagnosis  motor neuron disease    Referring Provider  Dr. Yvonna Alanisichard Bedlack    Onset Date/Surgical Date  -- slow progression diagnosis     Hand Dominance  Right    Next MD Visit  11/01/17    Prior Therapy  yes  for shoulder 20 years ago      Precautions   Precautions  Fall      Restrictions   Weight Bearing Restrictions  No      Balance Screen   Has the patient fallen in the past 6 months  Yes    How many times?  1-2    Has the patient had a decrease in activity level because of a fear of falling?   No    Is the patient reluctant to leave their home because of a fear of falling?   No      Home Environment   Living Environment  Private residence    Living Arrangements  Alone    Type of Home  House    Home Access  Ramped entrance    Home Layout  One level      Prior Function   Level of Independence  Independent    Vocation  Full time employment    Vocation Requirements  sitting, standing at a distribution center    Leisure  golf, hunt, fish, watch sports      Observation/Other Assessments  Focus on Therapeutic Outcomes (FOTO)   47% limitation       AROM   Lumbar Flexion  75    Lumbar Extension  30    Lumbar - Right Side Bend  30    Lumbar - Left Side Bend  30      Strength   Right Hip Flexion  4+/5    Right Hip Extension  4+/5    Right Hip ABduction  4+/5    Left Hip Flexion  4+/5    Left Hip Extension  4+/5    Left Hip ABduction  4+/5    Right Knee Flexion  4+/5    Right Knee Extension  4-/5    Left Knee Flexion  4+/5    Left Knee Extension  4-/5    Right Ankle Dorsiflexion  3+/5    Right Ankle Plantar Flexion  3/5    Right Ankle Inversion  4+/5    Right Ankle Eversion  4+/5    Left Ankle Dorsiflexion  3+/5    Left Ankle Plantar Flexion  3/5    Lumbar Flexion  3+/5    Lumbar Extension  4-/5      Flexibility   Hamstrings  bil HS to 50 degrees      Ambulation/Gait   Stairs  -- one step at at time    Pre-Gait Activities  pushes backs of knees against the chair with sit to stand;  able to squat    Gait Comments  right > left genurecurvatum      Berg Balance Test   Sit to Stand  Able to stand without using hands and stabilize independently    Standing Unsupported   Able to stand safely 2 minutes    Sitting with Back Unsupported but Feet Supported on Floor or Stool  Able to sit safely and securely 2 minutes    Stand to Sit  Sits safely with minimal use of hands    Transfers  Able to transfer safely, minor use of hands    Standing Unsupported with Eyes Closed  Able to stand 10 seconds safely    Standing Ubsupported with Feet Together  Able to place feet together independently and stand 1 minute safely    From Standing, Reach Forward with Outstretched Arm  Can reach forward >12 cm safely (5")    From Standing Position, Pick up Object from Floor  Able to pick up shoe safely and easily    From Standing Position, Turn to Look Behind Over each Shoulder  Looks behind from both sides and weight shifts well    Turn 360 Degrees  Able to turn 360 degrees safely in 4 seconds or less    Standing Unsupported, Alternately Place Feet on Step/Stool  Able to stand independently and safely and complete 8 steps in 20 seconds    Standing Unsupported, One Foot in Front  Able to plae foot ahead of the other independently and hold 30 seconds    Standing on One Leg  Able to lift leg independently and hold 5-10 seconds    Total Score  53             Objective measurements completed on examination: See above findings.              PT Education - 08/09/17 1907    Education provided  Yes    Education Details  bridging; abdominal brace;  hand to knee push    Person(s) Educated  Patient  Methods  Explanation;Demonstration;Handout    Comprehension  Returned demonstration;Verbalized understanding       PT Short Term Goals - 08/09/17 1927      PT SHORT TERM GOAL #1   Title  The patient will demonstrate the ability to activate transverse abdominus muscles and initial HEP for core strengthening    Time  4    Period  Weeks    Status  New    Target Date  09/06/17      PT SHORT TERM GOAL #2   Title  The patient will be able to rise from a chair without UEs or  compensatory pushing with the backs of his knees     Time  4    Period  Weeks    Status  New      PT SHORT TERM GOAL #3   Title  BERG balance score improved to 55/56 indicating decreased risk of falls     Time  4    Period  Weeks    Status  New        PT Long Term Goals - 08/09/17 1930      PT LONG TERM GOAL #1   Title  The patient will be independent with a HEP (Pilates -based) for further strengthening of core muscles    Time  8    Period  Weeks    Status  New    Target Date  10/04/17      PT LONG TERM GOAL #2   Title  The patient will have improved core (lumbo/pelvic/hip) strength improved to 4+/5 needed for walking > 15 minutes at work and for recreational activities hunting/fishing    Time  8    Period  Weeks    Status  New      PT LONG TERM GOAL #3   Title  Improved quad strength to 4-/5 and ankle plantarflexion and dorsiflexion to 4-/5 needed for gait safety and the ability to step on/off curb with greater ease    Time  8    Period  Weeks    Status  New      PT LONG TERM GOAL #4   Title  FOTO functional outcome score improved from 47% limitation to 36% indicating improved function    Time  8    Period  Weeks    Status  New             Plan - 08/09/17 1908    Clinical Impression Statement  The patient is a 37 year old 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  He is referred to PT for Pilates-based physical therapy to make his "core stronger to help his lack of balance."  He reports he has bil LE weakness throws off his balance.   Bil genu recurvatum during gait.  Tends to push with the backs of his knees to assist with sit to stand.  Goes up steps one at a time.  Quad strength 3+/5 and gastroc strength 3+/5.  Otherwise grossly 4/5 to 4+/5.  Decreased lower abdominal strength.  BERG balance score indicates min to moderate risk of falls with a score of 53/56.  He would benefit from PT to address these deficits.      History and Personal  Factors relevant to plan of care:  progressive motor neuron disease;  Hx of bil knee pain;  left shoulder crepitus and possible instability    Clinical Presentation  Evolving    Clinical  Presentation due to:  multi body regions and systems affected with anticipated progressive weakening     Clinical Decision Making  Low    Rehab Potential  Good    Clinical Impairments Affecting Rehab Potential  risk for falls     PT Frequency  2x / week    PT Duration  8 weeks    PT Treatment/Interventions  ADLs/Self Care Home Management;Gait training;Functional mobility training;Therapeutic activities;Therapeutic exercise;Balance training;Patient/family education;Manual techniques;Taping    PT Next Visit Plan  Pilates based lumbo/pelvic/hip core strengthening;  Quad strengthening;  balance ex    Consulted and Agree with Plan of Care  Patient       Patient will benefit from skilled therapeutic intervention in order to improve the following deficits and impairments:  Abnormal gait, Decreased strength, Decreased balance, Impaired perceived functional ability  Visit Diagnosis: Muscle weakness (generalized) - Plan: PT plan of care cert/re-cert  Other lack of coordination - Plan: PT plan of care cert/re-cert  Other abnormalities of gait and mobility - Plan: PT plan of care cert/re-cert     Problem List There are no active problems to display for this patient.  Lavinia Sharps, PT 08/09/17 7:44 PM Phone: [phone] Fax: [phone]  Vivien Presto 08/09/2017, 7:43 PM  [facility] [address], [address] [place], [place], [address] Phone: [phone]   Fax:  [phone]  Name: Nathan Pierce MRN: [id number] Date of Birth: 04-Sep-1980

## 2017-08-12 ENCOUNTER — Ambulatory Visit: Payer: BLUE CROSS/BLUE SHIELD | Admitting: Physical Therapy

## 2017-08-12 ENCOUNTER — Encounter: Payer: Self-pay | Admitting: Physical Therapy

## 2017-08-12 DIAGNOSIS — R2689 Other abnormalities of gait and mobility: Secondary | ICD-10-CM

## 2017-08-12 DIAGNOSIS — M6281 Muscle weakness (generalized): Secondary | ICD-10-CM | POA: Diagnosis not present

## 2017-08-12 DIAGNOSIS — R278 Other lack of coordination: Secondary | ICD-10-CM

## 2017-08-12 NOTE — Patient Instructions (Signed)

## 2017-08-12 NOTE — Therapy (Signed)
[facility] [address] [place], [place], [address] Phone: [phone]   Fax:  [phone]  Physical Therapy Treatment  Patient Details  Name: Nathan Pierce MRN: [id number] Date of Birth: 09-09-1980 Referring Provider: Dr. Yvonna Alanis   Encounter Date: 08/12/2017  PT End of Session - 08/12/17 0801    Visit Number  2    Date for PT Re-Evaluation  10/04/17    PT Start Time  0800    PT Stop Time  0846    PT Time Calculation (min)  46 min    Activity Tolerance  Patient tolerated treatment well    Behavior During Therapy  [facility] for tasks assessed/performed       History reviewed. No pertinent past medical history.  History reviewed. No pertinent surgical history.  There were no vitals filed for this visit.  Subjective Assessment - 08/12/17 0803    Subjective  Feel ok this AM. No pain,    Pertinent History  hx of bil knee pain from sports catcher and wrestling;  crepitus left shoulder;  melanoma upper back 1 year ago with slow healing wound    Limitations  Walking;Lifting;House hold activities    Currently in Pain?  No/denies    Multiple Pain Sites  No                      [facility] Adult PT Treatment/Exercise - 08/12/17 0001      Self-Care   Self-Care  ADL's;Posture Educated pt not working to the point of fatigue, moderation    ADL's  Posture/lumbar protective body mechanics    Posture  Alignment, what good posture is.       Lumbar Exercises: Aerobic   Nustep  L2 X 6 min  15 sec interval with only LTLE pushing 3x      Lumbar Exercises: Seated   Long Arc Quad on Chair  Strengthening;Left;1 set;20 reps;Weights    LAQ on Chair Weights (lbs)  2    Sit to Stand  10 reps Pt uses  RT > LT      Lumbar Exercises: Supine   Bridge  -- 6x    Other Supine Lumbar Exercises  Supine arm circles 5x each             PT Education - 08/12/17 0810    Education Details  Level 1 Pilates mat ex    Person(s) Educated  Patient    Methods  Explanation;Demonstration;Tactile cues;Verbal cues;Handout    Comprehension  Returned demonstration;Verbalized understanding       PT Short Term Goals - 08/12/17 9518      PT SHORT TERM GOAL #1   Title  The patient will demonstrate the ability to activate transverse abdominus muscles and initial HEP for core strengthening    Time  4    Period  Weeks    Status  Achieved        PT Long Term Goals - 08/09/17 1930      PT LONG TERM GOAL #1   Title  The patient will be independent with a HEP (Pilates -based) for further strengthening of core muscles    Time  8    Period  Weeks    Status  New    Target Date  10/04/17      PT LONG TERM GOAL #2   Title  The patient will have improved core (lumbo/pelvic/hip) strength improved to 4+/5 needed for walking > 15 minutes at  work and for recreational activities hunting/fishing    Time  8    Period  Weeks    Status  New      PT LONG TERM GOAL #3   Title  Improved quad strength to 4-/5 and ankle plantarflexion and dorsiflexion to 4-/5 needed for gait safety and the ability to step on/off curb with greater ease    Time  8    Period  Weeks    Status  New      PT LONG TERM GOAL #4   Title  FOTO functional outcome score improved from 47% limitation to 36% indicating improved function    Time  8    Period  Weeks    Status  New            Plan - 08/12/17 0802    Clinical Impression Statement  Pt is compliant in initial HEP. Today pt was educated in Pilates principles and how to apply them not only to exercises but to his ADLS as well.  He definitely has more difficulty engaging his LT quads and glutes more than the RT. Pt had a good handle on lower abdominal activation even with simple leg movements.     Rehab Potential  Good    Clinical Impairments Affecting Rehab Potential  risk for falls     PT Frequency  2x / week    PT Duration  8 weeks    PT Treatment/Interventions  ADLs/Self Care Home  Management;Gait training;Functional mobility training;Therapeutic activities;Therapeutic exercise;Balance training;Patient/family education;Manual techniques;Taping    PT Next Visit Plan  Pilates based lumbo/pelvic/hip core strengthening;  Quad strengthening Lt > RT try single leg stance or other beginning balance exercises.     Consulted and Agree with Plan of Care  Patient       Patient will benefit from skilled therapeutic intervention in order to improve the following deficits and impairments:  Abnormal gait, Decreased strength, Decreased balance, Impaired perceived functional ability  Visit Diagnosis: Muscle weakness (generalized)  Other lack of coordination  Other abnormalities of gait and mobility     Problem List There are no active problems to display for this patient.   COCHRAN,JENNIFER, PTA 08/12/2017, 8:50 AM  [facility] [address], [address] [place], [place], [address] Phone: [phone]   Fax:  [phone]  Name: Nathan Pierce MRN: [id number] Date of Birth: 02/26/1981

## 2017-08-16 ENCOUNTER — Encounter: Payer: Self-pay | Admitting: Physical Therapy

## 2017-08-17 ENCOUNTER — Ambulatory Visit: Payer: BLUE CROSS/BLUE SHIELD | Admitting: Physical Therapy

## 2017-08-17 ENCOUNTER — Encounter: Payer: Self-pay | Admitting: Physical Therapy

## 2017-08-17 DIAGNOSIS — R2689 Other abnormalities of gait and mobility: Secondary | ICD-10-CM

## 2017-08-17 DIAGNOSIS — M6281 Muscle weakness (generalized): Secondary | ICD-10-CM | POA: Diagnosis not present

## 2017-08-17 DIAGNOSIS — R278 Other lack of coordination: Secondary | ICD-10-CM

## 2017-08-17 NOTE — Therapy (Signed)
[facility] [address], [address] [place], [place], [address] Phone: [phone]   Fax:  [phone]  Physical Therapy Treatment  Patient Details  Name: Nathan LoftRobert Pierce MRN: [id number] Date of Birth: 12/24/1980 Referring Provider: Dr. Yvonna Alanisichard Bedlack   Encounter Date: 08/17/2017  PT End of Session - 08/17/17 0805    Visit Number  3    Date for PT Re-Evaluation  10/04/17    PT Start Time  0800    PT Stop Time  0843    PT Time Calculation (min)  43 min    Activity Tolerance  Patient tolerated treatment well    Behavior During Therapy  [facility] for tasks assessed/performed       History reviewed. No pertinent past medical history.  History reviewed. No pertinent surgical history.  There were no vitals filed for this visit.  Subjective Assessment - 08/17/17 0806    Subjective  After last session I could feel my LT leg was tired but I could still function fine.     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  5-10 min then need a break    Diagnostic tests  muscle biopsy    Patient Stated Goals  Not fall    Currently in Pain?  No/denies    Multiple Pain Sites  No                      [facility] Adult PT Treatment/Exercise - 08/17/17 0001      Lumbar Exercises: Stretches   Lower Trunk Rotation  3 reps;20 seconds Bil    Hip Flexor Stretch  -- Ball squeeze with pelvic tilt 10x    Gastroc Stretch  Right;Left;3 reps;20 seconds      Lumbar Exercises: Aerobic   Nustep  L2 8 min total: 2 min warm up then 30 sec where he isolated LTLE then 30 sec bil effort       Lumbar Exercises: Standing   Other Standing Lumbar Exercises  Tandem stance weight shifting to facilitate a heel lift. Bil 10 shifts. Pt cannot do standing heel  lift.    Other Standing Lumbar Exercises  LT single limb stance: 3x 20 sec  few finger support. VC to find the balls of his feet better.      Lumbar Exercises: Seated   Long Arc Quad on Chair  Strengthening;Left;2 sets;15 reps;Weights    LAQ on Chair Weights (lbs)  3    Sit to Stand  10 reps On mat,if pt used a slight bit of UE he could stand symmetr    Other Seated Lumbar Exercises  Green band horizontal abd 10x2 VC for posture and contraction of lower abdominals.     Other Seated Lumbar Exercises  Issued band for HEP      Lumbar Exercises: Supine   Clam  -- 2 x10, VC for core contraction    Bent Knee Raise  10 reps Bil, VC for core contraction    Bridge  -- Attempted but could not complete due to increased back pain    Other Supine Lumbar Exercises  Supine arm circles 10x each             PT Education - 08/17/17 0836    Education provided  Yes    Education Details  Weight shifting, single leg stance and green band horizontal pulls     Person(s) Educated  Patient    Methods  Explanation;Demonstration;Tactile cues;Verbal cues    Comprehension  Returned demonstration;Verbalized understanding       PT Short Term Goals - 08/12/17 1610      PT SHORT TERM GOAL #1   Title  The patient will demonstrate the ability to activate transverse abdominus muscles and initial HEP for core strengthening    Time  4    Period  Weeks    Status  Achieved        PT Long Term Goals - 08/09/17 1930      PT LONG TERM GOAL #1   Title  The patient will be independent with a HEP (Pilates -based) for further strengthening of core muscles    Time  8    Period  Weeks    Status  New    Target Date  10/04/17      PT LONG TERM GOAL #2   Title  The patient will have improved core (lumbo/pelvic/hip) strength improved to 4+/5 needed for walking > 15 minutes at work and for recreational activities hunting/fishing    Time  8    Period  Weeks    Status  New      PT LONG TERM GOAL #3   Title   Improved quad strength to 4-/5 and ankle plantarflexion and dorsiflexion to 4-/5 needed for gait safety and the ability to step on/off curb with greater ease    Time  8    Period  Weeks    Status  New      PT LONG TERM GOAL #4   Title  FOTO functional outcome score improved from 47% limitation to 36% indicating improved function    Time  8    Period  Weeks    Status  New            Plan - 08/17/17 0805    Clinical Impression Statement  Pt presents today with some fatigue due to a long work day yesterday. He had some LTLE fatigue after his first session but reports he was still functional. Today exercises were advanced some to include gastroc strength, balance, and seated postural strength. Pt could not do a standing heel raise even bilaterally but he could perform weight shifting in tandem stance and get his back ankle to lift  during the shifting. Pt also had difficulty in single limb stance feeling his metatarsal heads as he tended to rock posteriorly into his heel. This got some better with practice during the session. Pt could not perform supine bridge as this caused some pretty intense back pain. Pt is noncompliant with initial HEP at this time. He was encouraged to go a little bit throughout the day instead all at one time.     Rehab Potential  Good    Clinical Impairments Affecting Rehab Potential  risk for falls     PT Frequency  2x / week    PT Duration  8 weeks    PT Treatment/Interventions  ADLs/Self Care Home Management;Gait training;Functional mobility training;Therapeutic activities;Therapeutic exercise;Balance training;Patient/family education;Manual techniques;Taping    PT Next Visit Plan  Weight shifting on mini tramp, emphasize heel lift, LAQ stick with 3#, Single leg stance with RT toe taps on stairs.     Consulted and Agree with Plan of Care  Patient       Patient  will benefit from skilled therapeutic intervention in order to improve the following deficits and  impairments:  Abnormal gait, Decreased strength, Decreased balance, Impaired perceived functional ability  Visit Diagnosis: Muscle weakness (generalized)  Other lack of coordination  Other abnormalities of gait and mobility     Problem List There are no active problems to display for this patient.   COCHRAN,JENNIFER, PTA 08/17/2017, 8:55 AM  [facility] [address], [address] [place], [place], [address] Phone: [phone]   Fax:  [phone]  Name: Nathan Pierce MRN: [id number] Date of Birth: 01/31/1981

## 2017-08-19 ENCOUNTER — Ambulatory Visit: Payer: BLUE CROSS/BLUE SHIELD | Admitting: Physical Therapy

## 2017-08-19 ENCOUNTER — Encounter: Payer: Self-pay | Admitting: Physical Therapy

## 2017-08-19 DIAGNOSIS — R2689 Other abnormalities of gait and mobility: Secondary | ICD-10-CM

## 2017-08-19 DIAGNOSIS — M6281 Muscle weakness (generalized): Secondary | ICD-10-CM

## 2017-08-19 DIAGNOSIS — R278 Other lack of coordination: Secondary | ICD-10-CM

## 2017-08-19 NOTE — Therapy (Signed)
[facility] [address], [address] [place], [place], [address] Phone: [phone]   Fax:  [phone]  Physical Therapy Treatment  Patient Details  Name: Nathan LoftRobert Pierce MRN: [id number] Date of Birth: 03/08/1981 Referring Provider: Dr. Yvonna Alanisichard Bedlack   Encounter Date: 08/19/2017  PT End of Session - 08/19/17 0759    Visit Number  4    Date for PT Re-Evaluation  10/04/17    PT Start Time  0755    PT Stop Time  0844    PT Time Calculation (min)  49 min    Activity Tolerance  Patient tolerated treatment well    Behavior During Therapy  [facility] for tasks assessed/performed       History reviewed. No pertinent past medical history.  History reviewed. No pertinent surgical history.  There were no vitals filed for this visit.  Subjective Assessment - 08/19/17 0801    Subjective  Knee noticably sore going down a slight delcine. I am trying to get out of my chairs with better symmetry.    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  No/denies    Multiple Pain Sites  No                      [facility] Adult PT Treatment/Exercise - 08/19/17 0001      Neuro Re-ed    Neuro Re-ed Details   Floor ladder exercises for balance and lateral movement      Lumbar Exercises: Stretches   Gastroc Stretch  Right;Left;3 reps;30 seconds on slant board      Lumbar Exercises: Aerobic   Nustep  L2 10 min 30 sec intervals pushing only with LTLE      Lumbar Exercises: Machines for Strengthening   Cybex Knee Flexion  2 plates LTLE [id number]      Lumbar Exercises: Standing   Other Standing Lumbar Exercises  weight shifting on mini tramp 1 min bil intandem stance    Other Standing Lumbar Exercises  Hip abd alt 2x 10 VC to not lift so high      Lumbar  Exercises: Seated   Long Arc Quad on Chair  Strengthening;Both;2 sets;15 reps;Weights    LAQ on Chair Weights (lbs)  3    Sit to Stand  20 reps 2 finger support on mat bil UE: improved symmetry    Other Seated Lumbar Exercises  Green band horizontal abd 15x2 VC for posture and contraction of lower abdominals.       Lumbar Exercises: Supine   Ab Set  -- Ball, buttocks, TA  cocontractions 10x 5 sec hold               PT Short Term Goals - 08/12/17 [id number]      PT SHORT TERM GOAL #1   Title  The patient will demonstrate the ability to activate transverse abdominus muscles and initial HEP for core strengthening    Time  4    Period  Weeks    Status  Achieved        PT Long Term Goals - 08/09/17 1930      PT LONG TERM GOAL #1   Title  The patient will be independent with a HEP (Pilates -based) for further strengthening of core muscles  Time  8    Period  Weeks    Status  New    Target Date  10/04/17      PT LONG TERM GOAL #2   Title  The patient will have improved core (lumbo/pelvic/hip) strength improved to 4+/5 needed for walking > 15 minutes at work and for recreational activities hunting/fishing    Time  8    Period  Weeks    Status  New      PT LONG TERM GOAL #3   Title  Improved quad strength to 4-/5 and ankle plantarflexion and dorsiflexion to 4-/5 needed for gait safety and the ability to step on/off curb with greater ease    Time  8    Period  Weeks    Status  New      PT LONG TERM GOAL #4   Title  FOTO functional outcome score improved from 47% limitation to 36% indicating improved function    Time  8    Period  Weeks    Status  New            Plan - 08/19/17 0800    Clinical Impression Statement  Pt can perform his sit to stand with much improved symmetrical weight bearing and his knees do not press into the mat behind him. He can accomplish this with 2 finger support. Patient has difficulty with his lateral movement LT, this was demonstrated on  the floor ladder and when performing his hip abduction. Lt heel lift remains almost absent but when performing the weight shift exercise he can lift it some. Pain did not limit any of his treatment and he reports being slightly more compliant with his HEP.     Rehab Potential  Good    Clinical Impairments Affecting Rehab Potential  risk for falls     PT Frequency  2x / week    PT Duration  8 weeks    PT Treatment/Interventions  ADLs/Self Care Home Management;Gait training;Functional mobility training;Therapeutic activities;Therapeutic exercise;Balance training;Patient/family education;Manual techniques;Taping    PT Next Visit Plan  Weight shifting on mini tramp, emphasize heel lift, LAQ 4#, Single leg stance with RT toe taps on stairs. Add 1# to hip abduction in standing.     Consulted and Agree with Plan of Care  Patient       Patient will benefit from skilled therapeutic intervention in order to improve the following deficits and impairments:  Abnormal gait, Decreased strength, Decreased balance, Impaired perceived functional ability  Visit Diagnosis: Muscle weakness (generalized)  Other lack of coordination  Other abnormalities of gait and mobility     Problem List There are no active problems to display for this patient.   Shevaun Lovan, PTA 08/19/2017, 8:51 AM  [facility] [address], [address] [place], [place], [address] Phone: [phone]   Fax:  [phone]  Name: Nathan Pierce MRN: [id number] Date of Birth: April 26, 1981

## 2017-08-22 ENCOUNTER — Encounter: Payer: Self-pay | Admitting: Physical Therapy

## 2017-08-22 ENCOUNTER — Ambulatory Visit: Payer: BLUE CROSS/BLUE SHIELD | Admitting: Physical Therapy

## 2017-08-22 DIAGNOSIS — M6281 Muscle weakness (generalized): Secondary | ICD-10-CM | POA: Diagnosis not present

## 2017-08-22 DIAGNOSIS — R278 Other lack of coordination: Secondary | ICD-10-CM

## 2017-08-22 DIAGNOSIS — R2689 Other abnormalities of gait and mobility: Secondary | ICD-10-CM

## 2017-08-22 NOTE — Therapy (Signed)
[facility] [address], [address] [place], [place], [address] Phone: [phone]   Fax:  [phone]  Physical Therapy Treatment  Patient Details  Name: Nathan LoftRobert Buckles MRN: [id number] Date of Birth: 01/09/1981 Referring Provider: Dr. Yvonna Alanisichard Bedlack   Encounter Date: 08/22/2017  PT End of Session - 08/22/17 0801    Visit Number  5    Date for PT Re-Evaluation  10/04/17    PT Start Time  0800    PT Stop Time  0842    PT Time Calculation (min)  42 min    Activity Tolerance  Patient tolerated treatment well    Behavior During Therapy  [facility] for tasks assessed/performed       History reviewed. No pertinent past medical history.  History reviewed. No pertinent surgical history.  There were no vitals filed for this visit.  Subjective Assessment - 08/22/17 0802    Subjective  I did a lot of alking this weekend so my glutes are a little sore ( muscularly). I do feel a little more stable on my legs.    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  5-10 min then need a break    Diagnostic tests  muscle biopsy    Patient Stated Goals  Not fall    Currently in Pain?  No/denies    Multiple Pain Sites  No                      [facility] Adult PT Treatment/Exercise - 08/22/17 0001      Neuro Re-ed    Neuro Re-ed Details   Red ball plyotoss 20x2: Tandem stance, 80% weight bear in LT leg. LTLE is the back leg. 2 sets: Floor ladder balance exs including hign knee marching and lateral stepping 4x each LOB during the first set when ball was tossed to his RT      Lumbar Exercises: Stretches   Lower Trunk Rotation  3 reps;20 seconds Bil      Lumbar Exercises: Aerobic   Nustep  L2 10 min  PTA with status update       Lumbar  Exercises: Machines for Strengthening   Cybex Knee Flexion  2 plates 87F15x, 3 plates 64P15x       Lumbar Exercises: Standing   Other Standing Lumbar Exercises  weight shifting on mini tramp 1 min bil intandem stance VC to focus on heel lift    Other Standing Lumbar Exercises  Hip abd alt 2x 15 1# on LTLE VC to not lift so high      Lumbar Exercises: Seated   Long Arc Quad on Chair  Strengthening;Left;2 sets;15 reps;Weights    LAQ on Chair Weights (lbs)  4    Sit to Stand  20 reps 2 finger support on mat bil UE: improved symmetry      Lumbar Exercises: Supine   Ab Set  -- Ball, buttocks, TA  cocontractions 10x 5 sec hold               PT Short Term Goals - 08/22/17 [id number]      PT SHORT TERM GOAL #2   Title  The patient will be able to rise from a chair without UEs or compensatory pushing with the  backs of his knees     Time  4    Period  Weeks    Status  On-going Requires some UE to maintain LE equal weightbearing        PT Long Term Goals - 08/09/17 1930      PT LONG TERM GOAL #1   Title  The patient will be independent with a HEP (Pilates -based) for further strengthening of core muscles    Time  8    Period  Weeks    Status  New    Target Date  10/04/17      PT LONG TERM GOAL #2   Title  The patient will have improved core (lumbo/pelvic/hip) strength improved to 4+/5 needed for walking > 15 minutes at work and for recreational activities hunting/fishing    Time  8    Period  Weeks    Status  New      PT LONG TERM GOAL #3   Title  Improved quad strength to 4-/5 and ankle plantarflexion and dorsiflexion to 4-/5 needed for gait safety and the ability to step on/off curb with greater ease    Time  8    Period  Weeks    Status  New      PT LONG TERM GOAL #4   Title  FOTO functional outcome score improved from 47% limitation to 36% indicating improved function    Time  8    Period  Weeks    Status  New            Plan - 08/22/17 0801    Clinical Impression  Statement  Pt needs to use some of his UE for sit to stand only for the purpose of being able to achieve equal weight bearing in his LE. Wihtout UE he tends to use RTLE much more. Pt was challenged with dynamic balance exercises. He lost his balance during a tandem stance exercise while tossing a ball. Pain does not limit his treatment. Today he reported many times he "felt" his LT hip and ankle muscles more than he has in the past. Pt was monitored for safety throughout hte session.     Rehab Potential  Good    Clinical Impairments Affecting Rehab Potential  risk for falls     PT Frequency  2x / week    PT Duration  8 weeks    PT Treatment/Interventions  ADLs/Self Care Home Management;Gait training;Functional mobility training;Therapeutic activities;Therapeutic exercise;Balance training;Patient/family education;Manual techniques;Taping    PT Next Visit Plan  Weight shifting on mini tramp, emphasize heel lift, LAQ 4#, Single leg stance with RT toe taps on stairs. Continue to use light weights for hip strength.     Consulted and Agree with Plan of Care  Patient       Patient will benefit from skilled therapeutic intervention in order to improve the following deficits and impairments:  Abnormal gait, Decreased strength, Decreased balance, Impaired perceived functional ability  Visit Diagnosis: Muscle weakness (generalized)  Other lack of coordination  Other abnormalities of gait and mobility     Problem List There are no active problems to display for this patient.   COCHRAN,JENNIFER, PTA 08/22/2017, 8:51 AM  [facility] [address] [place], [place], [address] Phone: [phone]   Fax:  [phone]  Name: Bearett Porcaro MRN: [id number] Date of Birth: 21-Mar-1981

## 2017-08-25 ENCOUNTER — Ambulatory Visit: Payer: BLUE CROSS/BLUE SHIELD | Admitting: Physical Therapy

## 2017-08-25 ENCOUNTER — Encounter: Payer: Self-pay | Admitting: Physical Therapy

## 2017-08-25 ENCOUNTER — Encounter (INDEPENDENT_AMBULATORY_CARE_PROVIDER_SITE_OTHER): Payer: Self-pay | Admitting: NEUROLOGY

## 2017-08-25 DIAGNOSIS — R2689 Other abnormalities of gait and mobility: Secondary | ICD-10-CM

## 2017-08-25 DIAGNOSIS — M6281 Muscle weakness (generalized): Secondary | ICD-10-CM | POA: Diagnosis not present

## 2017-08-25 DIAGNOSIS — R278 Other lack of coordination: Secondary | ICD-10-CM

## 2017-08-25 NOTE — Therapy (Signed)
[facility] [address], [address] [place], [place], [address] Phone: [phone]   Fax:  [phone]  Physical Therapy Treatment  Patient Details  Name: Nathan LoftRobert Pierce MRN: [id number] Date of Birth: 06/01/1981 Referring Provider: Dr. Yvonna Alanisichard Bedlack   Encounter Date: 08/25/2017  PT End of Session - 08/25/17 1300    Visit Number  6    Date for PT Re-Evaluation  10/04/17    PT Start Time  0730    PT Stop Time  0810 30 min treatment session, 10 min on Nu-step unsupervised    PT Time Calculation (min)  40 min    Activity Tolerance  Patient tolerated treatment well       History reviewed. No pertinent past medical history.  History reviewed. No pertinent surgical history.  There were no vitals filed for this visit.  Subjective Assessment - 08/25/17 0733    Subjective  Reports minor soreness the next day after therapy but not bad.  The patient reports he can get up a little easier now.  Has left knee pain with walking downhill but I've had it for years.  Reports instability with staggered standing and lateral balance challenges.      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  No/denies    Pain Score  0-No pain                      [facility] Adult PT Treatment/Exercise - 08/25/17 0001      Neuro Re-ed    Neuro Re-ed Details   dynamic standing balance on unstable surface and narrow base of support      Lumbar Exercises: Stretches   Other Lumbar Stretch Exercise  calf stretch on edge of step 3x 20 sec      Lumbar Exercises: Aerobic   Nustep  L3 6 min supervised, 10 min unsupervised      Lumbar Exercises: Standing   Other Standing Lumbar Exercises  4 way weight shift on black foam       Lumbar Exercises: Supine   Bent Knee Raise  -- Bil, VC for core contraction    Basic Lumbar  Stabilization  -- ball pass between feet to hands 10x    Other Supine Lumbar Exercises  green band hip extension 10x    Other Supine Lumbar Exercises  green band circles right/left 5x each way clockwise/counter clockwise      Lumbar Exercises: Sidelying   Clam  Right;Left;15 reps with ball between feet      Knee/Hip Exercises: Standing   Other Standing Knee Exercises  hold black band (like TRX) with arm circles with staggered stand, SLS with foot prop on BOSU and on black foam right/left      Knee/Hip Exercises: Seated   Sit to Sand  -- from high table with green band around thighs,ball b/w knees               PT Short Term Goals - 08/22/17 0819      PT SHORT TERM GOAL #2   Title  The patient will be able to rise from a chair without UEs or compensatory pushing with the backs of his knees     Time  4    Period  Weeks    Status  On-going Requires some UE to maintain  LE equal weightbearing        PT Long Term Goals - 08/09/17 1930      PT LONG TERM GOAL #1   Title  The patient will be independent with a HEP (Pilates -based) for further strengthening of core muscles    Time  8    Period  Weeks    Status  New    Target Date  10/04/17      PT LONG TERM GOAL #2   Title  The patient will have improved core (lumbo/pelvic/hip) strength improved to 4+/5 needed for walking > 15 minutes at work and for recreational activities hunting/fishing    Time  8    Period  Weeks    Status  New      PT LONG TERM GOAL #3   Title  Improved quad strength to 4-/5 and ankle plantarflexion and dorsiflexion to 4-/5 needed for gait safety and the ability to step on/off curb with greater ease    Time  8    Period  Weeks    Status  New      PT LONG TERM GOAL #4   Title  FOTO functional outcome score improved from 47% limitation to 36% indicating improved function    Time  8    Period  Weeks    Status  New            Plan - 08/25/17 1304    Clinical Impression Statement  The  patient reports some episodes of ankle and knee instability on occassion but happening less often.  Decreased knee compensatory hyperextension noted.  No episodes of knee or ankle give way during the treatment session.  Standing on a soft surface was difficult for him.  With stepping off the foam he misjudged where the edge was and stumbled but recovers quickly with just contact guard assist from therapist.  Close supervision with all standing challenges for safety.      Rehab Potential  Good    Clinical Impairments Affecting Rehab Potential  risk for falls     PT Frequency  2x / week    PT Duration  8 weeks    PT Treatment/Interventions  ADLs/Self Care Home Management;Gait training;Functional mobility training;Therapeutic activities;Therapeutic exercise;Balance training;Patient/family education;Manual techniques;Taping    PT Next Visit Plan  Weight shifting on mini tramp, emphasize heel lift, LAQ 4#, Single leg stance with RT toe taps on stairs. Continue to use light weights for hip strength.        Patient will benefit from skilled therapeutic intervention in order to improve the following deficits and impairments:  Abnormal gait, Decreased strength, Decreased balance, Impaired perceived functional ability  Visit Diagnosis: Muscle weakness (generalized)  Other lack of coordination  Other abnormalities of gait and mobility     Problem List There are no active problems to display for this patient.  Lavinia Sharps, PT 08/25/17 1:14 PM Phone: [phone] Fax: [phone]  Vivien Presto 08/25/2017, 1:13 PM  [facility] [address] [place], [place], [address] Phone: [phone]   Fax:  [phone]  Name: Nathan Pierce MRN: [id number] Date of Birth: 1981/06/27

## 2017-08-29 ENCOUNTER — Ambulatory Visit: Payer: BLUE CROSS/BLUE SHIELD | Admitting: Physical Therapy

## 2017-08-29 ENCOUNTER — Encounter: Payer: Self-pay | Admitting: Physical Therapy

## 2017-08-29 DIAGNOSIS — M6281 Muscle weakness (generalized): Secondary | ICD-10-CM | POA: Diagnosis not present

## 2017-08-29 DIAGNOSIS — R2689 Other abnormalities of gait and mobility: Secondary | ICD-10-CM

## 2017-08-29 DIAGNOSIS — R278 Other lack of coordination: Secondary | ICD-10-CM

## 2017-08-29 NOTE — Therapy (Signed)
[facility] [address] [place], [place], [address] Phone: [phone]   Fax:  [phone]  Physical Therapy Treatment  Patient Details  Name: Adley Mazurowski MRN: [id number] Date of Birth: 06-29-1981 Referring Provider: Dr. Yvonna Alanis   Encounter Date: 08/29/2017  PT End of Session - 08/29/17 0755    Visit Number  7    Date for PT Re-Evaluation  10/04/17    PT Start Time  0756    PT Stop Time  0840    PT Time Calculation (min)  44 min    Activity Tolerance  Patient tolerated treatment well    Behavior During Therapy  [facility] for tasks assessed/performed       History reviewed. No pertinent past medical history.  History reviewed. No pertinent surgical history.  There were no vitals filed for this visit.  Subjective Assessment - 08/29/17 0757    Subjective  No pain, did a lot of stairs this weekend, still feel most comfortable doing them step to step.    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  5-10 min then need a break    Currently in Pain?  No/denies    Multiple Pain Sites  No                      [facility] Adult PT Treatment/Exercise - 08/29/17 0001      Neuro Re-ed    Neuro Re-ed Details   dynamic standing balance on unstable surface and narrow base of support tossind red plyo ball SLS on teal pod with RT toe taps on 2nd stair 3x10 CGA NO UE      Lumbar Exercises: Stretches   Active Hamstring Stretch  Right;Left;3 reps;30 seconds    Other Lumbar Stretch Exercise  gastroc off stairs 3x 30 sec      Lumbar Exercises: Aerobic   Nustep  L3 x 10 min 30 sec pushing LTLE then 30 sec rest      Lumbar Exercises: Seated   Long Arc Quad on Chair  Strengthening;Left;2 sets;15 reps;Weights    LAQ  on Chair Weights (lbs)  4      Lumbar Exercises: Sidelying   Clam  Right;Left;Both 2x15 greeen band added and given for home      Knee/Hip Exercises: Standing   Knee Flexion  Strengthening;Both;2 sets;15 reps 2#    Hip Abduction  Stengthening;Both;2 sets;15 reps;Knee straight 2# on LTLE             PT Education - 08/29/17 0838    Education provided  Yes    Education Details  Resisted clamshells, green band issued    Person(s) Educated  Patient    Methods  Explanation;Demonstration;Tactile cues;Verbal cues    Comprehension  Returned demonstration;Verbalized understanding       PT Short Term Goals - 08/22/17 6962      PT SHORT TERM GOAL #2   Title  The patient will be able to rise from a chair without UEs or compensatory pushing with the backs of his knees     Time  4    Period  Weeks    Status  On-going Requires some UE to maintain LE equal weightbearing        PT Long  Term Goals - 08/09/17 1930      PT LONG TERM GOAL #1   Title  The patient will be independent with a HEP (Pilates -based) for further strengthening of core muscles    Time  8    Period  Weeks    Status  New    Target Date  10/04/17      PT LONG TERM GOAL #2   Title  The patient will have improved core (lumbo/pelvic/hip) strength improved to 4+/5 needed for walking > 15 minutes at work and for recreational activities hunting/fishing    Time  8    Period  Weeks    Status  New      PT LONG TERM GOAL #3   Title  Improved quad strength to 4-/5 and ankle plantarflexion and dorsiflexion to 4-/5 needed for gait safety and the ability to step on/off curb with greater ease    Time  8    Period  Weeks    Status  New      PT LONG TERM GOAL #4   Title  FOTO functional outcome score improved from 47% limitation to 36% indicating improved function    Time  8    Period  Weeks    Status  New            Plan - 08/29/17 0756    Clinical Impression Statement  Pt has some minor LOB with dynamic balance  exercises involving the LTLE being a greater stabilizing leg than the RT ( tandem ) When pt stood on black foam he required some CGA. Strengthening exercises are challenging but are performed with proper technique.     Rehab Potential  Good    Clinical Impairments Affecting Rehab Potential  risk for falls     PT Frequency  2x / week    PT Duration  8 weeks    PT Treatment/Interventions  ADLs/Self Care Home Management;Gait training;Functional mobility training;Therapeutic activities;Therapeutic exercise;Balance training;Patient/family education;Manual techniques;Taping    Consulted and Agree with Plan of Care  Patient       Patient will benefit from skilled therapeutic intervention in order to improve the following deficits and impairments:  Abnormal gait, Decreased strength, Decreased balance, Impaired perceived functional ability  Visit Diagnosis: Muscle weakness (generalized)  Other lack of coordination  Other abnormalities of gait and mobility     Problem List There are no active problems to display for this patient.   Crue Otero, PTA 08/29/2017, 8:40 AM  [facility] [address], [address] [place], [place], [address] Phone: [phone]   Fax:  [phone]  Name: Neill LoftRobert Snedden MRN: [id number] Date of Birth: 03/27/1981

## 2017-09-02 ENCOUNTER — Encounter: Payer: Self-pay | Admitting: Physical Therapy

## 2017-09-02 ENCOUNTER — Ambulatory Visit: Payer: BLUE CROSS/BLUE SHIELD | Attending: Neurology | Admitting: Physical Therapy

## 2017-09-02 DIAGNOSIS — M6281 Muscle weakness (generalized): Secondary | ICD-10-CM | POA: Diagnosis present

## 2017-09-02 DIAGNOSIS — M545 Low back pain: Secondary | ICD-10-CM | POA: Diagnosis present

## 2017-09-02 DIAGNOSIS — R2689 Other abnormalities of gait and mobility: Secondary | ICD-10-CM | POA: Diagnosis present

## 2017-09-02 DIAGNOSIS — R278 Other lack of coordination: Secondary | ICD-10-CM | POA: Diagnosis present

## 2017-09-02 NOTE — Therapy (Signed)
[facility] [address] [place], [place], [address] Phone: [phone]   Fax:  [phone]  Physical Therapy Treatment  Patient Details  Name: Nathan Pierce MRN: [id number] Date of Birth: 1980/09/21 Referring Provider: Dr. Franchot Mimes   Encounter Date: 09/02/2017  PT End of Session - 09/02/17 0803    Visit Number  8    Date for PT Re-Evaluation  10/04/17    PT Start Time  0802    PT Stop Time  0841    PT Time Calculation (min)  39 min    Activity Tolerance  Patient tolerated treatment well    Behavior During Therapy  [facility] for tasks assessed/performed       History reviewed. No pertinent past medical history.  History reviewed. No pertinent surgical history.  There were no vitals filed for this visit.  Subjective Assessment - 09/02/17 0805    Subjective  Overall not bad but my knees are achey this AM.     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  5-10 min then need a break    Diagnostic tests  muscle biopsy    Patient Stated Goals  Not fall    Currently in Pain?  -- My knees are achey this AM, it is raining    Multiple Pain Sites  No                      [facility] Adult PT Treatment/Exercise - 09/02/17 0001      Neuro Re-ed    Neuro Re-ed Details   Clock stepping to cones bil SBA Tandem stance: 30 sec      Lumbar Exercises: Stretches   Active Hamstring Stretch  Right;Left;3 reps;30 seconds    Other Lumbar Stretch Exercise  gastroc off the stairs bil 3x30       Lumbar Exercises: Aerobic   Nustep  L3 x 10 min 30 sec pushing LTLE then 30 sec rest      Knee/Hip Exercises: Standing   SLS  bil 3x 3-5 sec    Walking with Sports Cord  20# 10x each direction SBA      Knee/Hip Exercises:  Seated   Sit to Sand  1 set;10 reps;with UE support No knees to mat               PT Short Term Goals - 09/02/17 0831      PT SHORT TERM GOAL #2   Title  The patient will be able to rise from a chair without UEs or compensatory pushing with the backs of his knees     Time  4    Period  Weeks    Status  Achieved        PT Long Term Goals - 08/09/17 1930      PT LONG TERM GOAL #1   Title  The patient will be independent with a HEP (Pilates -based) for further strengthening of core muscles    Time  8    Period  Weeks    Status  New    Target Date  10/04/17      PT LONG TERM GOAL #2   Title  The patient will have improved core (lumbo/pelvic/hip) strength improved to 4+/5 needed  for walking > 15 minutes at work and for recreational activities hunting/fishing    Time  8    Period  Weeks    Status  New      PT LONG TERM GOAL #3   Title  Improved quad strength to 4-/5 and ankle plantarflexion and dorsiflexion to 4-/5 needed for gait safety and the ability to step on/off curb with greater ease    Time  8    Period  Weeks    Status  New      PT LONG TERM GOAL #4   Title  FOTO functional outcome score improved from 47% limitation to 36% indicating improved function    Time  8    Period  Weeks    Status  New            Plan - 09/02/17 0804    Clinical Impression Statement  Pt presented today with some achey knees. This did not hinder his movement during the session. He continues to be challenged with dynamic balance exercises wherehis LTLE is the main stabilizing leg. He did not fall during todays session, but there was a few times he became wobbly and needed to "catch his balance."  He can perfrom sit to stand without resting his legs on the mat, which met that goal. Questionable compliance with HEP.     Rehab Potential  Good    Clinical Impairments Affecting Rehab Potential  risk for falls     PT Frequency  2x / week    PT Duration  8 weeks    PT  Treatment/Interventions  ADLs/Self Care Home Management;Gait training;Functional mobility training;Therapeutic activities;Therapeutic exercise;Balance training;Patient/family education;Manual techniques;Taping    PT Next Visit Plan  Dynamic balance exercises, hip strength, RT quad and gastroc strength for goals.     Consulted and Agree with Plan of Care  Patient       Patient will benefit from skilled therapeutic intervention in order to improve the following deficits and impairments:  Abnormal gait, Decreased strength, Decreased balance, Impaired perceived functional ability, Pain  Visit Diagnosis: Muscle weakness (generalized)  Other lack of coordination  Other abnormalities of gait and mobility     Problem List There are no active problems to display for this patient.   Cuahutemoc Attar, PTA 09/02/2017, 10:21 AM  [facility] [address] [place], [place], [address] Phone: [phone]   Fax:  [phone]  Name: Nathan Pierce MRN: [id number] Date of Birth: 11-10-1980

## 2017-09-05 ENCOUNTER — Encounter (INDEPENDENT_AMBULATORY_CARE_PROVIDER_SITE_OTHER): Payer: Self-pay | Admitting: NEUROLOGY

## 2017-09-06 ENCOUNTER — Ambulatory Visit: Payer: BLUE CROSS/BLUE SHIELD | Admitting: Physical Therapy

## 2017-09-06 ENCOUNTER — Ambulatory Visit: Payer: BLUE CROSS/BLUE SHIELD

## 2017-09-06 DIAGNOSIS — M6281 Muscle weakness (generalized): Secondary | ICD-10-CM | POA: Diagnosis not present

## 2017-09-06 DIAGNOSIS — R278 Other lack of coordination: Secondary | ICD-10-CM

## 2017-09-06 DIAGNOSIS — R2689 Other abnormalities of gait and mobility: Secondary | ICD-10-CM

## 2017-09-06 NOTE — Therapy (Signed)
[facility] [address], [address] [place], [place], [address] Phone: [phone]   Fax:  [phone]  Physical Therapy Treatment  Patient Details  Name: Nathan LoftRobert Pierce MRN: [id number] Date of Birth: 08/18/1980 Referring Provider: Dr. Yvonna Alanisichard Bedlack   Encounter Date: 09/06/2017  PT End of Session - 09/06/17 0840    Visit Number  9    Date for PT Re-Evaluation  10/04/17    PT Start Time  0800    PT Stop Time  0840    PT Time Calculation (min)  40 min    Activity Tolerance  Patient tolerated treatment well    Behavior During Therapy  [facility] for tasks assessed/performed       History reviewed. No pertinent past medical history.  History reviewed. No pertinent surgical history.  There were no vitals filed for this visit.  Subjective Assessment - 09/06/17 0806    Subjective  Lt knee pain today.  I feel a little tired after therapy but not too bad.      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  Yes    Pain Score  2     Pain Location  Knee    Pain Orientation  Left;Right    Pain Descriptors / Indicators  Aching    Pain Type  Chronic pain    Pain Onset  More than a month ago    Pain Frequency  Intermittent    Aggravating Factors   comes and goes    Pain Relieving Factors  unknown                      [facility] Adult PT Treatment/Exercise - 09/06/17 0001      Lumbar Exercises: Stretches   Active Hamstring Stretch  Right;Left;3 reps;30 seconds      Lumbar Exercises: Aerobic   Nustep  L3 x 8min 30 sec pushing LTLE then 30 sec rest      Lumbar Exercises: Seated   Long Arc Quad on Chair  Strengthening;Left;2 sets;15 reps;Weights    LAQ on Chair Weights (lbs)  4      Lumbar Exercises: Supine   Straight Leg Raise  20 reps;5 seconds focus on bracing    Other Supine Lumbar Exercises   abd set with table legs x20      Lumbar Exercises: Sidelying   Clam  Right;Left;Both 2x15 greeen band added and given for home      Knee/Hip Exercises: Standing   Knee Flexion  Strengthening;Both;2 sets;15 reps 2#    Hip Abduction  Stengthening;Both;2 sets;15 reps;Knee straight 2# on LTLE    SLS  bil 3x 3-5 sec               PT Short Term Goals - 09/02/17 0831      PT SHORT TERM GOAL #2   Title  The patient will be able to rise from a chair without UEs or compensatory pushing with the backs of his knees     Time  4    Period  Weeks    Status  Achieved        PT Long Term Goals - 08/09/17 1930      PT LONG TERM GOAL #1   Title  The patient will be independent with a HEP (Pilates -based) for further strengthening  of core muscles    Time  8    Period  Weeks    Status  New    Target Date  10/04/17      PT LONG TERM GOAL #2   Title  The patient will have improved core (lumbo/pelvic/hip) strength improved to 4+/5 needed for walking > 15 minutes at work and for recreational activities hunting/fishing    Time  8    Period  Weeks    Status  New      PT LONG TERM GOAL #3   Title  Improved quad strength to 4-/5 and ankle plantarflexion and dorsiflexion to 4-/5 needed for gait safety and the ability to step on/off curb with greater ease    Time  8    Period  Weeks    Status  New      PT LONG TERM GOAL #4   Title  FOTO functional outcome score improved from 47% limitation to 36% indicating improved function    Time  8    Period  Weeks    Status  New            Plan - 09/06/17 0818    Clinical Impression Statement  Pt continues to be challenged by core strength and dynamic balance exercises and demonstrates Lt LE instability.  Pt continues to have difficulty rising from low chair/couch at home.  Pt requires close SBA/CGA with standing exercises due to instability.  Pt denies any recent falls at home or work.  Pt will continue to benefit from skilled PT for LE  strength, core stability and endurance to improve safety at home and in the community.      Rehab Potential  Good    Clinical Impairments Affecting Rehab Potential  risk for falls     PT Frequency  2x / week    PT Duration  8 weeks    PT Treatment/Interventions  ADLs/Self Care Home Management;Gait training;Functional mobility training;Therapeutic activities;Therapeutic exercise;Balance training;Patient/family education;Manual techniques;Taping    PT Next Visit Plan   Retest Berg for STG. Dynamic balance exercises, hip strength, RT quad and gastroc strength for goals.     Recommended Other Services  2nd attempt sent 09/06/17    Consulted and Agree with Plan of Care  Patient       Patient will benefit from skilled therapeutic intervention in order to improve the following deficits and impairments:  Abnormal gait, Decreased strength, Decreased balance, Impaired perceived functional ability, Pain  Visit Diagnosis: Muscle weakness (generalized)  Other lack of coordination  Other abnormalities of gait and mobility     Problem List There are no active problems to display for this patient.   Lorrene Reid, PT 09/06/17 8:41 AM  [facility] [address], [address] [place], [place], [address] Phone: [phone]   Fax:  [phone]  Name: Nathan Pierce MRN: [id number] Date of Birth: September 12, 1980

## 2017-09-09 ENCOUNTER — Encounter: Payer: Self-pay | Admitting: Physical Therapy

## 2017-09-09 ENCOUNTER — Ambulatory Visit: Payer: BLUE CROSS/BLUE SHIELD | Admitting: Physical Therapy

## 2017-09-09 DIAGNOSIS — M6281 Muscle weakness (generalized): Secondary | ICD-10-CM | POA: Diagnosis not present

## 2017-09-09 DIAGNOSIS — R2689 Other abnormalities of gait and mobility: Secondary | ICD-10-CM

## 2017-09-09 DIAGNOSIS — R278 Other lack of coordination: Secondary | ICD-10-CM

## 2017-09-09 NOTE — Therapy (Signed)
[facility] [address], [address] [place], [place], [address] Phone: [phone]   Fax:  [phone]  Physical Therapy Treatment  Patient Details  Name: Nathan LoftRobert Pierce MRN: [id number] Date of Birth: 12/16/1980 Referring Provider: Dr. Yvonna Alanisichard Bedlack   Encounter Date: 09/09/2017  PT End of Session - 09/09/17 0807    Visit Number  10    Date for PT Re-Evaluation  10/04/17    PT Start Time  0804    PT Stop Time  0847    PT Time Calculation (min)  43 min    Activity Tolerance  Patient tolerated treatment well    Behavior During Therapy  [facility] for tasks assessed/performed       History reviewed. No pertinent past medical history.  History reviewed. No pertinent surgical history.  There were no vitals filed for this visit.  Subjective Assessment - 09/09/17 0806    Subjective  I walked 3 miles yesterday so my LT leg is tired today.     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  5-10 min then need a break    Diagnostic tests  muscle biopsy    Patient Stated Goals  Not fall    Currently in Pain?  No/denies    Multiple Pain Sites  No                      [facility] Adult PT Treatment/Exercise - 09/09/17 0001      Neuro Re-ed    Neuro Re-ed Details   Tandem stance, single leg stance      Lumbar Exercises: Stretches   Active Hamstring Stretch  Right;Left;2 reps;20 seconds    Lower Trunk Rotation  5 reps;20 seconds    Standing Side Bend  Right;Left;2 reps;10 seconds    Piriformis Stretch  Right;Left;1 rep;20 seconds      Lumbar Exercises: Aerobic   Nustep  L3 x5 min, L4 x 5 min PTA present for status      Lumbar Exercises: Seated   Long Arc Quad on Chair  Strengthening;Both;2 sets;15 reps;Weights    LAQ on Chair  Weights (lbs)  4      Knee/Hip Exercises: Standing   Hip Abduction  Stengthening;Both;2 sets;10 reps;Knee straight    Abduction Limitations  3# today    Walking with Sports Cord  25# side stepping 10x each CGA               PT Short Term Goals - 09/02/17 0831      PT SHORT TERM GOAL #2   Title  The patient will be able to rise from a chair without UEs or compensatory pushing with the backs of his knees     Time  4    Period  Weeks    Status  Achieved        PT Long Term Goals - 08/09/17 1930      PT LONG TERM GOAL #1   Title  The patient will be independent with a HEP (Pilates -based) for further strengthening of core muscles    Time  8    Period  Weeks    Status  New    Target Date  10/04/17      PT LONG TERM GOAL #  2   Title  The patient will have improved core (lumbo/pelvic/hip) strength improved to 4+/5 needed for walking > 15 minutes at work and for recreational activities hunting/fishing    Time  8    Period  Weeks    Status  New      PT LONG TERM GOAL #3   Title  Improved quad strength to 4-/5 and ankle plantarflexion and dorsiflexion to 4-/5 needed for gait safety and the ability to step on/off curb with greater ease    Time  8    Period  Weeks    Status  New      PT LONG TERM GOAL #4   Title  FOTO functional outcome score improved from 47% limitation to 36% indicating improved function    Time  8    Period  Weeks    Status  New            Plan - 09/09/17 0846    Rehab Potential  Good    Clinical Impairments Affecting Rehab Potential  risk for falls     PT Frequency  2x / week    PT Duration  8 weeks    PT Next Visit Plan  Pt out of town next week. BERG next visit, balance exercises, RT quad and ankle strength       Patient will benefit from skilled therapeutic intervention in order to improve the following deficits and impairments:  Abnormal gait, Decreased strength, Decreased balance, Impaired perceived functional ability, Pain  Visit  Diagnosis: Muscle weakness (generalized)  Other lack of coordination  Other abnormalities of gait and mobility     Problem List There are no active problems to display for this patient.   Nathan Pierce, PTA 09/09/2017, 8:51 AM  [facility] [address], [address] [place], [place], [address] Phone: [phone]   Fax:  [phone]  Name: Nathan Pierce MRN: [id number] Date of Birth: 24-Aug-1980

## 2017-09-19 ENCOUNTER — Ambulatory Visit: Payer: BLUE CROSS/BLUE SHIELD | Admitting: Physical Therapy

## 2017-09-19 ENCOUNTER — Encounter: Payer: Self-pay | Admitting: Physical Therapy

## 2017-09-19 DIAGNOSIS — R2689 Other abnormalities of gait and mobility: Secondary | ICD-10-CM

## 2017-09-19 DIAGNOSIS — R278 Other lack of coordination: Secondary | ICD-10-CM

## 2017-09-19 DIAGNOSIS — M6281 Muscle weakness (generalized): Secondary | ICD-10-CM | POA: Diagnosis not present

## 2017-09-19 NOTE — Therapy (Signed)
[facility] [address], [address] [place], [place], [address] Phone: [phone]   Fax:  [phone]  Physical Therapy Treatment  Patient Details  Name: Nathan LoftRobert Pierce MRN: [id number] Date of Birth: 06/01/1981 Referring Provider: Dr. Yvonna Alanisichard Bedlack   Encounter Date: 09/19/2017  PT End of Session - 09/19/17 0805    Visit Number  11    Date for PT Re-Evaluation  10/04/17    PT Start Time  0802    PT Stop Time  0848    PT Time Calculation (min)  46 min    Activity Tolerance  Patient tolerated treatment well    Behavior During Therapy  [facility] for tasks assessed/performed       History reviewed. No pertinent past medical history.  History reviewed. No pertinent surgical history.  There were no vitals filed for this visit.  Subjective Assessment - 09/19/17 0806    Subjective  My back has been terrible last few days. Worse is when I get up in the AM. It gets some better if I walk around a little. Getting out of bed has been really bad.    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  -- 7/10 in the AM, currently 0/10  sitting talking right now.     Pain Score  7     Pain Location  Back    Pain Orientation  Lower    Pain Descriptors / Indicators  Throbbing;Radiating    Aggravating Factors   After sleeping or being in the bed for awhile    Pain Relieving Factors  Sitting                      [facility] Adult PT Treatment/Exercise - 09/19/17 0001      Lumbar Exercises: Stretches   Single Knee to Chest Stretch  Right;Left;2 reps;30 seconds    Lower Trunk Rotation  5 reps;20 seconds    Pelvic Tilt  10 reps;5 seconds    Other Lumbar Stretch Exercise  knee to opposite shoulder stretch 2x 20 sec with strap to assist.      Lumbar  Exercises: Quadruped   Madcat/Old Horse  -- 2x5      Manual Therapy   Manual Therapy  -- Long leg axis pull gently, 3x 15 sec    Soft tissue mobilization  Lt lumbar in sidelying     Kinesiotex  Inhibit Muscle Lumbar               PT Short Term Goals - 09/02/17 0831      PT SHORT TERM GOAL #2   Title  The patient will be able to rise from a chair without UEs or compensatory pushing with the backs of his knees     Time  4    Period  Weeks    Status  Achieved        PT Long Term Goals - 08/09/17 1930      PT LONG TERM GOAL #1   Title  The patient will be independent with a HEP (Pilates -based) for further strengthening of core muscles    Time  8    Period  Weeks    Status  New    Target Date  10/04/17  PT LONG TERM GOAL #2   Title  The patient will have improved core (lumbo/pelvic/hip) strength improved to 4+/5 needed for walking > 15 minutes at work and for recreational activities hunting/fishing    Time  8    Period  Weeks    Status  New      PT LONG TERM GOAL #3   Title  Improved quad strength to 4-/5 and ankle plantarflexion and dorsiflexion to 4-/5 needed for gait safety and the ability to step on/off curb with greater ease    Time  8    Period  Weeks    Status  New      PT LONG TERM GOAL #4   Title  FOTO functional outcome score improved from 47% limitation to 36% indicating improved function    Time  8    Period  Weeks    Status  New            Plan - 09/19/17 0805    Clinical Impression Statement  Pt ambulated into the clinic today with significant altalgic gait. He had work done to his tatoo last week that put him on his stomach for around an hour before he got a break. This repeated about 4x.  His back pain began around 2 hours after. It took pt 30 min to be able to get out of bed on Saturday. This has somewhat improved as this AM it only took 15 min. He is using a golf club intermittently as a cane for support.  He was able to tolerate  stretching to his back and hips as long as he moved slowly and was cued by PTA to initiate the movement with his lower abs first. He had moderate spasm long the posterior Lt iliac crest into the glutes. This did improve with soft tissue work. We ended the session applying Ktape to support his muscles as he needs to be at work today. He also has a TENS unit at home that PTA suggested he go home and get to wear thoughout the day.     Rehab Potential  Good    Clinical Impairments Affecting Rehab Potential  risk for falls     PT Frequency  2x / week    PT Duration  8 weeks    PT Treatment/Interventions  ADLs/Self Care Home Management;Gait training;Functional mobility training;Therapeutic activities;Therapeutic exercise;Balance training;Patient/family education;Manual techniques;Taping    PT Next Visit Plan  Assess pain, treat if limiting to balance work    Financial planner with Plan of Care  --       Patient will benefit from skilled therapeutic intervention in order to improve the following deficits and impairments:  Abnormal gait, Decreased strength, Decreased balance, Impaired perceived functional ability, Pain  Visit Diagnosis: Muscle weakness (generalized)  Other lack of coordination  Other abnormalities of gait and mobility     Problem List There are no active problems to display for this patient.   Nathan Pierce, PTA 09/19/2017, 8:57 AM  [facility] [address], [address] [place], [place], [address] Phone: [phone]   Fax:  [phone]  Name: Nathan Pierce MRN: [id number] Date of Birth: Mar 18, 1981

## 2017-09-21 ENCOUNTER — Encounter: Payer: Self-pay | Admitting: Physical Therapy

## 2017-09-21 ENCOUNTER — Ambulatory Visit: Payer: BLUE CROSS/BLUE SHIELD | Admitting: Physical Therapy

## 2017-09-21 DIAGNOSIS — M6281 Muscle weakness (generalized): Secondary | ICD-10-CM

## 2017-09-21 DIAGNOSIS — R278 Other lack of coordination: Secondary | ICD-10-CM

## 2017-09-21 DIAGNOSIS — R2689 Other abnormalities of gait and mobility: Secondary | ICD-10-CM

## 2017-09-21 NOTE — Therapy (Signed)
[facility] [address] [place], [place], [address] Phone: [phone]   Fax:  [phone]  Physical Therapy Treatment  Patient Details  Name: Nathan Pierce MRN: [id number] Date of Birth: 1980/12/04 Referring Provider: Dr. Yvonna Alanis   Encounter Date: 09/21/2017  PT End of Session - 09/21/17 0759    Visit Number  12    Date for PT Re-Evaluation  10/04/17    PT Start Time  0759    PT Stop Time  0841    PT Time Calculation (min)  42 min    Activity Tolerance  Patient tolerated treatment well    Behavior During Therapy  [facility] for tasks assessed/performed       History reviewed. No pertinent past medical history.  History reviewed. No pertinent surgical history.  There were no vitals filed for this visit.  Subjective Assessment - 09/21/17 0800    Subjective  My back is better than Monday. I do feel a "burn" in my back at times I noticed yesterday. Gettin gout of bed continues to get better.     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  Yes    Pain Score  5     Pain Location  Back    Pain Orientation  Lower    Pain Descriptors / Indicators  Tender    Multiple Pain Sites  No                      [facility] Adult PT Treatment/Exercise - 09/21/17 0001      Lumbar Exercises: Stretches   Single Knee to Chest Stretch  Right;Left;3 reps;20 seconds    Lower Trunk Rotation  5 reps;20 seconds    Pelvic Tilt  10 reps;5 seconds    Standing Side Bend  Right;Left;2 reps;10 seconds    Other Lumbar Stretch Exercise  knee to opposite shoulder stretch 2x 20 sec with strap to assist.      Lumbar Exercises: Aerobic   Stationary Bike  L1 x 10 min  PTA monitored and did update      Lumbar Exercises: Supine   Clam  -- 2x 15, VC  to gently pelvic tilt but engage abs >      Manual Therapy   Manual Therapy  -- Long leg axis pull gently, 3x 20 sec               PT Short Term Goals - 09/02/17 0831      PT SHORT TERM GOAL #2   Title  The patient will be able to rise from a chair without UEs or compensatory pushing with the backs of his knees     Time  4    Period  Weeks    Status  Achieved        PT Long Term Goals - 08/09/17 1930      PT LONG TERM GOAL #1   Title  The patient will be independent with a HEP (Pilates -based) for further strengthening of core muscles    Time  8    Period  Weeks    Status  New    Target Date  10/04/17      PT LONG TERM GOAL #2   Title  The patient will  have improved core (lumbo/pelvic/hip) strength improved to 4+/5 needed for walking > 15 minutes at work and for recreational activities hunting/fishing    Time  8    Period  Weeks    Status  New      PT LONG TERM GOAL #3   Title  Improved quad strength to 4-/5 and ankle plantarflexion and dorsiflexion to 4-/5 needed for gait safety and the ability to step on/off curb with greater ease    Time  8    Period  Weeks    Status  New      PT LONG TERM GOAL #4   Title  FOTO functional outcome score improved from 47% limitation to 36% indicating improved function    Time  8    Period  Weeks    Status  New            Plan - 09/21/17 0759    Clinical Impression Statement  Pt continues to have antalgic gait. He reports his pain continues although it is improved since Monday. Getting out of bed continues to get faster. Pt reports [place]. PTA suggested he might look into it at this time considering his back pain. He was unsure if the tape helped and elected to not  do again. Pt was cued throughout to constantly engage his abdominals and let his stretches relax in a very slow manner.     Rehab Potential  Good    Clinical Impairments Affecting Rehab Potential  risk for falls     PT  Frequency  2x / week    PT Duration  8 weeks    PT Treatment/Interventions  ADLs/Self Care Home Management;Gait training;Functional mobility training;Therapeutic activities;Therapeutic exercise;Balance training;Patient/family education;Manual techniques;Taping    PT Next Visit Plan  Assess pain, treat if limiting to balance work, BERG next week    Consulted and Agree with Plan of Care  Patient       Patient will benefit from skilled therapeutic intervention in order to improve the following deficits and impairments:  Abnormal gait, Decreased strength, Decreased balance, Impaired perceived functional ability, Pain  Visit Diagnosis: Muscle weakness (generalized)  Other lack of coordination  Other abnormalities of gait and mobility     Problem List There are no active problems to display for this patient.   Natayah Warmack, PTA 09/21/2017, 8:42 AM  [facility] [address], [address] [place], [place], [address] Phone: [phone]   Fax:  [phone]  Name: Nathan Pierce MRN: [id number] Date of Birth: 04/08/1981

## 2017-09-27 ENCOUNTER — Ambulatory Visit: Payer: BLUE CROSS/BLUE SHIELD

## 2017-09-27 DIAGNOSIS — R278 Other lack of coordination: Secondary | ICD-10-CM

## 2017-09-27 DIAGNOSIS — M545 Low back pain, unspecified: Secondary | ICD-10-CM

## 2017-09-27 DIAGNOSIS — M6281 Muscle weakness (generalized): Secondary | ICD-10-CM

## 2017-09-27 DIAGNOSIS — R2689 Other abnormalities of gait and mobility: Secondary | ICD-10-CM

## 2017-09-27 NOTE — Therapy (Signed)
[facility] [address], [address] [place], [place], [address] Phone: [phone]   Fax:  [phone]  Physical Therapy Treatment  Patient Details  Name: Nathan LoftRobert Pierce MRN: [id number] Date of Birth: 10/24/1980 Referring Provider: Dr. Yvonna Alanisichard Bedlack   Encounter Date: 09/27/2017  PT End of Session - 09/27/17 0845    Visit Number  13    Date for PT Re-Evaluation  11/08/17    PT Start Time  0805    PT Stop Time  0845    PT Time Calculation (min)  40 min    Activity Tolerance  Patient tolerated treatment well    Behavior During Therapy  [facility] for tasks assessed/performed       History reviewed. No pertinent past medical history.  History reviewed. No pertinent surgical history.  There were no vitals filed for this visit.  Subjective Assessment - 09/27/17 0807    Subjective  My back is still bothering me.  I don't know what is happening but it has been hurting for 1.5 weeks.  This has limited my ability to exercise.      Currently in Pain?  Yes    Pain Score  4     Pain Location  Back    Pain Orientation  Lower;Left;Right    Pain Descriptors / Indicators  Aching;Sore;Shooting;Burning    Pain Type  Chronic pain    Pain Onset  More than a month ago    Pain Frequency  Intermittent    Aggravating Factors   morning hours, being in bed    Pain Relieving Factors  getting up and moving around, sitting, muscle relaxers         [facility] PT Assessment - 09/27/17 0001      Assessment   Medical Diagnosis  motor neuron disease      Home Environment   Living Environment  Private residence      Prior Function   Level of Independence  Independent    Vocation  Full time employment      Observation/Other Assessments   Focus on Therapeutic Outcomes (FOTO)   40% limitation      Strength   Right Knee Flexion  4+/5    Right Knee Extension  4+/5    Left Knee Flexion  4+/5    Left Knee Extension  4+/5    Right Ankle Dorsiflexion  4/5     Left Ankle Dorsiflexion  4/5    Lumbar Flexion  4-/5      Ambulation/Gait   Stairs  Yes    Stairs Assistance  6: Modified independent (Device/Increase time)    Stair Management Technique  Two rails;Step to pattern    Pre-Gait Activities  steps: max UE support on Lt, 1 UE support on Rt    Gait Comments  right > left genurecurvatum, instability of Lt knee             No data recorded       [facility] Adult PT Treatment/Exercise - 09/27/17 0001      Lumbar Exercises: Stretches   Active Hamstring Stretch  Right;Left;2 reps;20 seconds    Single Knee to Chest Stretch  Right;Left;3 reps;20 seconds    Lower Trunk Rotation  5 reps;20 seconds    Pelvic Tilt  10 reps;5 seconds    Other Lumbar Stretch Exercise  knee to opposite shoulder stretch 2x 20 sec with strap to assist.      Lumbar Exercises: Aerobic   Stationary Bike  L1  x 10 min  PT present for goal assessment and FOTO      Knee/Hip Exercises: Standing   Forward Step Up  Right;2 sets;10 reps;Step Height: 4" verbal cues for technique      Knee/Hip Exercises: Seated   Abd/Adduction Limitations  heel and toe raises 2x10    Sit to Sand  1 set;10 reps;without UE support verbal cues for technique               PT Short Term Goals - 09/02/17 0831      PT SHORT TERM GOAL #2   Title  The patient will be able to rise from a chair without UEs or compensatory pushing with the backs of his knees     Time  4    Period  Weeks    Status  Achieved        PT Long Term Goals - 09/27/17 0810      PT LONG TERM GOAL #1   Title  The patient will be independent with a HEP (Pilates -based) for further strengthening of core muscles    Time  6    Period  Weeks    Status  On-going    Target Date  11/08/17      PT LONG TERM GOAL #2   Title  The patient will have improved core (lumbo/pelvic/hip) strength improved to 4+/5 needed for walking > 15 minutes at work and for recreational activities hunting/fishing    Baseline  pt with  weak core and slow mobility.      Target Date  11/08/17      PT LONG TERM GOAL #3   Title  ascend and descend curb on the Rt without UE support or use of momentum to improve safety with community mobility    Baseline  max UE support on the Lt, 1 UE support with momentum on the Rt    Time  6    Period  Weeks    Status  Revised    Target Date  11/08/17      PT LONG TERM GOAL #4   Title  FOTO functional outcome score improved from 47% limitation to 36% indicating improved function    Baseline  40% limitation    Time  6    Period  Weeks    Status  On-going    Target Date  11/08/17            Plan - 09/27/17 6295    Clinical Impression Statement  Pt is making progress with PT regarding LE strength, safety and stability.  Pt reports 40% overall improvement regarding strength and endurance.  LE strength is improved although pt with marked weakness on the Lt with step up/down and moderate weakness on the Rt with the same activity.  Pt demonstrates recurvatum bilaterally with gait and demonstrates intermittent episodes of instability with gait when fatigued.  Pt denies any falls.  Pt had a flare-up of back pain over the past week so this slowed his progress.  Pre-ALS so slow progress regarding strength.  Pt will continue to benefit from skilled PT for LE and core strength, flexibility and functional mobility training for improved safety and endurance for home and community tasks.      Rehab Potential  Good    Clinical Impairments Affecting Rehab Potential  risk for falls     PT Frequency  2x / week    PT Duration  6 weeks    PT Treatment/Interventions  ADLs/Self  Care Home Management;Gait training;Functional mobility training;Therapeutic activities;Therapeutic exercise;Balance training;Patient/family education;Manual techniques;Taping    PT Next Visit Plan  functional mobility, LE strength, BERG next if back is feeling better    Recommended Other Services  2nd attempt sent 09/06/17, recert sent  09/27/17    Consulted and Agree with Plan of Care  Patient       Patient will benefit from skilled therapeutic intervention in order to improve the following deficits and impairments:  Abnormal gait, Decreased strength, Decreased balance, Impaired perceived functional ability, Pain  Visit Diagnosis: Muscle weakness (generalized) - Plan: PT plan of care cert/re-cert  Other lack of coordination - Plan: PT plan of care cert/re-cert  Other abnormalities of gait and mobility - Plan: PT plan of care cert/re-cert  Acute bilateral low back pain without sciatica - Plan: PT plan of care cert/re-cert     Problem List There are no active problems to display for this patient.    Lorrene Reid, PT 09/27/17 8:47 AM  [facility] [address] [place], [place], [address] Phone: [phone]   Fax:  [phone]  Name: Nathan Pierce MRN: [id number] Date of Birth: 06-07-1981

## 2017-09-30 ENCOUNTER — Encounter: Payer: Self-pay | Admitting: Physical Therapy

## 2017-09-30 ENCOUNTER — Ambulatory Visit: Payer: BLUE CROSS/BLUE SHIELD | Admitting: Physical Therapy

## 2017-09-30 DIAGNOSIS — R278 Other lack of coordination: Secondary | ICD-10-CM

## 2017-09-30 DIAGNOSIS — M545 Low back pain, unspecified: Secondary | ICD-10-CM

## 2017-09-30 DIAGNOSIS — M6281 Muscle weakness (generalized): Secondary | ICD-10-CM | POA: Diagnosis not present

## 2017-09-30 DIAGNOSIS — R2689 Other abnormalities of gait and mobility: Secondary | ICD-10-CM

## 2017-09-30 NOTE — Therapy (Signed)
[facility] [address], [address] [place], [place], [address] Phone: [phone]   Fax:  [phone]  Physical Therapy Treatment  Patient Details  Name: Nathan LoftRobert Mendiola MRN: [id number] Date of Birth: 04/01/1981 Referring Provider: Dr. Yvonna Alanisichard Bedlack   Encounter Date: 09/30/2017  PT End of Session - 09/30/17 0806    Visit Number  14    Date for PT Re-Evaluation  11/08/17    PT Start Time  0803    PT Stop Time  0846    PT Time Calculation (min)  43 min    Activity Tolerance  Patient tolerated treatment well    Behavior During Therapy  [facility] for tasks assessed/performed       History reviewed. No pertinent past medical history.  History reviewed. No pertinent surgical history.  There were no vitals filed for this visit.  Subjective Assessment - 09/30/17 0808    Subjective  Went to Urgent Care on Wednesday. He got a prescription for anti-inflammatory meds.     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  5-10 min then need a break    Diagnostic tests  muscle biopsy    Patient Stated Goals  Not fall    Currently in Pain?  Yes    Pain Score  3     Pain Location  Back    Pain Orientation  Lower;Left    Pain Descriptors / Indicators  Aching;Radiating Radiates into LT buttocks a little    Multiple Pain Sites  No         [facility] PT Assessment - 09/30/17 0001      Berg Balance Test   Sit to Stand  Able to stand without using hands and stabilize independently    Standing Unsupported  Able to stand safely 2 minutes    Sitting with Back Unsupported but Feet Supported on Floor or Stool  Able to sit safely and securely 2 minutes    Stand to Sit  Sits safely with minimal use of hands    Transfers  Able to transfer safely, minor use  of hands    Standing Unsupported with Eyes Closed  Able to stand 10 seconds safely    Standing Ubsupported with Feet Together  Able to place feet together independently and stand 1 minute safely    From Standing, Reach Forward with Outstretched Arm  Can reach confidently >25 cm (10")    From Standing Position, Pick up Object from Floor  Able to pick up shoe safely and easily    From Standing Position, Turn to Look Behind Over each Shoulder  Looks behind from both sides and weight shifts well    Turn 360 Degrees  Able to turn 360 degrees safely in 4 seconds or less    Standing Unsupported, Alternately Place Feet on Step/Stool  Able to stand independently and safely and complete 8 steps in 20 seconds    Standing Unsupported, One Foot in Front  Able to plae foot ahead of the other independently and hold 30 seconds    Standing on One Leg  Able to lift leg independently and hold 5-10 seconds    Total Score  54            No data recorded  [facility] Adult PT Treatment/Exercise - 09/30/17 0001      Lumbar Exercises: Stretches   Active Hamstring Stretch  Right;Left;2 reps;20 seconds    Single Knee to Chest Stretch  Right;Left;3 reps;20 seconds    Lower Trunk Rotation  5 reps;20 seconds Pain at end    Pelvic Tilt  10 reps;5 seconds    Other Lumbar Stretch Exercise  knee to opposite shoulder stretch 2x 20 sec with strap to assist.      Lumbar Exercises: Aerobic   Stationary Bike  L2 x 10 min PTA present      Manual Therapy   Manual Therapy  -- Long leg axis pull gently, 3x 20 sec: 2 sets               PT Short Term Goals - 09/02/17 0831      PT SHORT TERM GOAL #2   Title  The patient will be able to rise from a chair without UEs or compensatory pushing with the backs of his knees     Time  4    Period  Weeks    Status  Achieved        PT Long Term Goals - 09/27/17 1610      PT LONG TERM GOAL #1   Title  The patient will be independent with a HEP (Pilates -based)  for further strengthening of core muscles    Time  6    Period  Weeks    Status  On-going    Target Date  11/08/17      PT LONG TERM GOAL #2   Title  The patient will have improved core (lumbo/pelvic/hip) strength improved to 4+/5 needed for walking > 15 minutes at work and for recreational activities hunting/fishing    Baseline  pt with weak core and slow mobility.      Target Date  11/08/17      PT LONG TERM GOAL #3   Title  ascend and descend curb on the Rt without UE support or use of momentum to improve safety with community mobility    Baseline  max UE support on the Lt, 1 UE support with momentum on the Rt    Time  6    Period  Weeks    Status  Revised    Target Date  11/08/17      PT LONG TERM GOAL #4   Title  FOTO functional outcome score improved from 47% limitation to 36% indicating improved function    Baseline  40% limitation    Time  6    Period  Weeks    Status  On-going    Target Date  11/08/17            Plan - 09/30/17 0807    Clinical Impression Statement  Despite low back pain, pt was able to increase his BERG score by 1 point to 54 pts. Pt continues to be limited by low back pain and self described muscle spams. He had 2 episodes where he verbally yelped with a pain.     Rehab Potential  Good    Clinical Impairments Affecting Rehab Potential  risk for falls     PT Frequency  2x / week    PT Duration  6 weeks    PT Treatment/Interventions  ADLs/Self Care Home Management;Gait training;Functional mobility training;Therapeutic activities;Therapeutic exercise;Balance training;Patient/family education;Manual techniques;Taping    PT Next Visit Plan  functional mobility, LE strength,    Consulted and Agree  with Plan of Care  Patient       Patient will benefit from skilled therapeutic intervention in order to improve the following deficits and impairments:  Abnormal gait, Decreased strength, Decreased balance, Impaired perceived functional ability,  Pain  Visit Diagnosis: Muscle weakness (generalized)  Other lack of coordination  Other abnormalities of gait and mobility  Acute bilateral low back pain without sciatica     Problem List There are no active problems to display for this patient.   Avry Roedl, PTA 09/30/2017, 8:58 AM  [facility] [address], [address] [place], [place], [address] Phone: [phone]   Fax:  [phone]  Name: Nayson Traweek MRN: [id number] Date of Birth: 10-01-1980

## 2017-10-03 ENCOUNTER — Ambulatory Visit: Payer: BLUE CROSS/BLUE SHIELD | Attending: Neurology | Admitting: Physical Therapy

## 2017-10-03 ENCOUNTER — Encounter: Payer: Self-pay | Admitting: Physical Therapy

## 2017-10-03 DIAGNOSIS — R2689 Other abnormalities of gait and mobility: Secondary | ICD-10-CM | POA: Insufficient documentation

## 2017-10-03 DIAGNOSIS — M545 Low back pain, unspecified: Secondary | ICD-10-CM

## 2017-10-03 DIAGNOSIS — M6281 Muscle weakness (generalized): Secondary | ICD-10-CM | POA: Diagnosis present

## 2017-10-03 DIAGNOSIS — R278 Other lack of coordination: Secondary | ICD-10-CM | POA: Diagnosis present

## 2017-10-03 NOTE — Therapy (Signed)
[facility] [address], [address] [place], [place], [address] Phone: [phone]   Fax:  [phone]  Physical Therapy Treatment  Patient Details  Name: Nathan Pierce MRN: [id number] Date of Birth: Mar 02, 1981 Referring Provider: Dr. Yvonna Alanis   Encounter Date: 10/03/2017  PT End of Session - 10/03/17 0804    Visit Number  15    Date for PT Re-Evaluation  11/08/17    PT Start Time  0804    PT Stop Time  0846    PT Time Calculation (min)  42 min    Activity Tolerance  Patient tolerated treatment well    Behavior During Therapy  [facility] for tasks assessed/performed       History reviewed. No pertinent past medical history.  History reviewed. No pertinent surgical history.  There were no vitals filed for this visit.  Subjective Assessment - 10/03/17 0805    Subjective  The new meds are really helping. Back pain much less. Walked a good bit at the concert this weekend and pt reports his balance was pretty good.     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  Yes    Pain Score  1     Pain Location  Back    Pain Orientation  Lower;Left    Pain Relieving Factors  medication    Multiple Pain Sites  No                No data recorded       [facility] Adult PT Treatment/Exercise - 10/03/17 0001      Lumbar Exercises: Stretches   Active Hamstring Stretch  Right;Left;2 reps;20 seconds    Single Knee to Chest Stretch  Right;Left;3 reps;20 seconds    Lower Trunk Rotation  5 reps;20 seconds Pain at end    Pelvic Tilt  10 reps;5 seconds    Gastroc Stretch  2 reps;30 seconds off stairs      Lumbar Exercises: Aerobic   Stationary Bike  L2 x 10 min at end of session    Elliptical  L1 R3 2 min      Lumbar Exercises: Standing   Other Standing Lumbar Exercises  Hip abd bil 4# 2x10    Other Standing Lumbar Exercises  Mini tramp wt shifting 1 min 3 ways      Lumbar Exercises: Seated   Long Arc Quad on Chair  Strengthening;Both;20 reps;Weights    LAQ on Chair Weights (lbs)  4      Knee/Hip Exercises: Standing   Forward Step Up  Right;1 set;10 reps;Hand Hold: 2;Step Height: 6" VC to not push up with his arm, could not do LT               PT Short Term Goals - 09/02/17 0831      PT SHORT TERM GOAL #2   Title  The patient will be able to rise from a chair without UEs or compensatory pushing with the backs of his knees     Time  4    Period  Weeks    Status  Achieved        PT Long Term Goals - 09/27/17 0810      PT LONG TERM GOAL #1  Title  The patient will be independent with a HEP (Pilates -based) for further strengthening of core muscles    Time  6    Period  Weeks    Status  On-going    Target Date  11/08/17      PT LONG TERM GOAL #2   Title  The patient will have improved core (lumbo/pelvic/hip) strength improved to 4+/5 needed for walking > 15 minutes at work and for recreational activities hunting/fishing    Baseline  pt with weak core and slow mobility.      Target Date  11/08/17      PT LONG TERM GOAL #3   Title  ascend and descend curb on the Rt without UE support or use of momentum to improve safety with community mobility    Baseline  max UE support on the Lt, 1 UE support with momentum on the Rt    Time  6    Period  Weeks    Status  Revised    Target Date  11/08/17      PT LONG TERM GOAL #4   Title  FOTO functional outcome score improved from 47% limitation to 36% indicating improved function    Baseline  40% limitation    Time  6    Period  Weeks    Status  On-going    Target Date  11/08/17            Plan - 10/03/17 0805    Clinical Impression Statement  Pt's back was feeling much better today due to the anti-inflammatory medication  he was given. He was able to tolerate 45 min of active exercise including balance and strengthening wihtout exacerbating the back too much. The back was a little sore after session.     Rehab Potential  Good    Clinical Impairments Affecting Rehab Potential  risk for falls     PT Frequency  2x / week    PT Duration  6 weeks    PT Treatment/Interventions  ADLs/Self Care Home Management;Gait training;Functional mobility training;Therapeutic activities;Therapeutic exercise;Balance training;Patient/family education;Manual techniques;Taping    PT Next Visit Plan  functional mobility, LE strength,    Consulted and Agree with Plan of Care  Patient       Patient will benefit from skilled therapeutic intervention in order to improve the following deficits and impairments:  Abnormal gait, Decreased strength, Decreased balance, Impaired perceived functional ability, Pain  Visit Diagnosis: Muscle weakness (generalized)  Other lack of coordination  Other abnormalities of gait and mobility  Acute bilateral low back pain without sciatica     Problem List There are no active problems to display for this patient.   Loden Laurent, PTA 10/03/2017, 8:45 AM  [facility] [address], [address] [place], [place], [address] Phone: [phone]   Fax:  [phone]  Name: Nathan Pierce MRN: [id number] Date of Birth: 08/17/1980

## 2017-10-06 ENCOUNTER — Encounter: Payer: Self-pay | Admitting: Physical Therapy

## 2017-10-06 ENCOUNTER — Ambulatory Visit: Payer: BLUE CROSS/BLUE SHIELD | Admitting: Physical Therapy

## 2017-10-06 DIAGNOSIS — M6281 Muscle weakness (generalized): Secondary | ICD-10-CM

## 2017-10-06 DIAGNOSIS — M545 Low back pain, unspecified: Secondary | ICD-10-CM

## 2017-10-06 DIAGNOSIS — R278 Other lack of coordination: Secondary | ICD-10-CM

## 2017-10-06 DIAGNOSIS — R2689 Other abnormalities of gait and mobility: Secondary | ICD-10-CM

## 2017-10-06 NOTE — Therapy (Signed)
[facility] [address] [place], [place], [address] Phone: [phone]   Fax:  [phone]  Physical Therapy Treatment  Patient Details  Name: Fuquan Wilson MRN: [id number] Date of Birth: 22-Jan-1981 Referring Provider: Dr. Yvonna Alanis   Encounter Date: 10/06/2017  PT End of Session - 10/06/17 0907    Visit Number  16    Date for PT Re-Evaluation  11/08/17    PT Start Time  0800    PT Stop Time  0845    PT Time Calculation (min)  45 min    Activity Tolerance  Patient tolerated treatment well       History reviewed. No pertinent past medical history.  History reviewed. No pertinent surgical history.  There were no vitals filed for this visit.  Subjective Assessment - 10/06/17 0802    Subjective  My back is feeling a lot better.  Naproxen has helped.  Lying down bothers me the most.  I'm waiting to hear about a clinical trial (for ALS) in [place].      Currently in Pain?  Yes    Pain Score  3     Pain Location  Back    Pain Type  Chronic pain    Aggravating Factors   lying down                        [facility] Adult PT Treatment/Exercise - 10/06/17 0001      Lumbar Exercises: Stretches   Active Hamstring Stretch  Right;Left;5 reps on 2nd step    Hip Flexor Stretch  Right;Left;5 reps on 2nd step    Other Lumbar Stretch Exercise  psoas doorway stretch 3x5    Other Lumbar Stretch Exercise  quadruped rocking front to back and with bias for hip mobility      Lumbar Exercises: Aerobic   Stationary Bike  L3 7 min      Lumbar Exercises: Standing   Other Standing Lumbar Exercises  sidestepping green band 4 laps holding counter for balance      Lumbar Exercises: Seated   Long Arc Quad on Chair  Strengthening;Both;15 reps;Weights    LAQ on Chair Weights (lbs)  5    Other Seated Lumbar Exercises  foam roll push down 10x    Other Seated Lumbar Exercises  3# plyo ball chops, Vs and ear to ear 1 min  each side      Knee/Hip Exercises: Seated   Sit to Sand  10 reps;without UE support high table               PT Short Term Goals - 10/06/17 0915      PT SHORT TERM GOAL #1   Title  The patient will demonstrate the ability to activate transverse abdominus muscles and initial HEP for core strengthening    Status  Achieved      PT SHORT TERM GOAL #2   Title  The patient will be able to rise from a chair without UEs or compensatory pushing with the backs of his knees     Status  Achieved      PT SHORT TERM GOAL #3   Title  BERG balance score improved to 55/56 indicating decreased risk of falls     Time  4    Period  Weeks    Status  On-going        PT Long Term Goals - 10/06/17 6962  PT LONG TERM GOAL #1   Title  The patient will be independent with a HEP (Pilates -based) for further strengthening of core muscles    Time  6    Period  Weeks    Status  On-going      PT LONG TERM GOAL #2   Title  The patient will have improved core (lumbo/pelvic/hip) strength improved to 4+/5 needed for walking > 15 minutes at work and for recreational activities hunting/fishing    Time  8    Status  On-going      PT LONG TERM GOAL #3   Title  ascend and descend curb on the Rt without UE support or use of momentum to improve safety with community mobility    Time  6    Status  On-going      PT LONG TERM GOAL #4   Title  FOTO functional outcome score improved from 47% limitation to 36% indicating improved function    Time  6    Period  Weeks    Status  On-going            Plan - 10/06/17 0907    Clinical Impression Statement  The patient is able to participate in low to moderate level stretching and strengthening of core and LEs without exacerbation of LBP.   Wide based gait with tendency for compensatory knee hyperextension secondary to weakness.   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      Rehab Potential  Good    Clinical Impairments Affecting Rehab Potential  risk for falls     PT Frequency  2x / week    PT Duration  6 weeks    PT Treatment/Interventions  ADLs/Self Care Home Management;Gait training;Functional mobility training;Therapeutic activities;Therapeutic exercise;Balance training;Patient/family education;Manual techniques;Taping    PT Next Visit Plan  functional mobility, LE strength, core strength;  avoid point of muscular soreness which may exacerbate symptoms       Patient will benefit from skilled therapeutic intervention in order to improve the following deficits and impairments:  Abnormal gait, Decreased strength, Decreased balance, Impaired perceived functional ability, Pain  Visit Diagnosis: Muscle weakness (generalized)  Other lack of coordination  Other abnormalities of gait and mobility  Acute bilateral low back pain without sciatica     Problem List There are no active problems to display for this patient.  Lavinia SharpsStacy Luis Sami, PT 10/06/17 9:17 AM Phone: [phone] Fax: [phone]  Vivien PrestoSimpson, Aria Pickrell C 10/06/2017, 9:17 AM  [facility] [address], [address] [place], [place], [address] Phone: [phone]   Fax:  [phone]  Name: Neill LoftRobert Manges MRN: [id number] Date of Birth: 12/04/1980

## 2017-10-12 ENCOUNTER — Ambulatory Visit: Payer: BLUE CROSS/BLUE SHIELD | Admitting: Physical Therapy

## 2017-10-12 ENCOUNTER — Encounter: Payer: Self-pay | Admitting: Physical Therapy

## 2017-10-12 DIAGNOSIS — M6281 Muscle weakness (generalized): Secondary | ICD-10-CM

## 2017-10-12 DIAGNOSIS — M545 Low back pain, unspecified: Secondary | ICD-10-CM

## 2017-10-12 DIAGNOSIS — R2689 Other abnormalities of gait and mobility: Secondary | ICD-10-CM

## 2017-10-12 DIAGNOSIS — R278 Other lack of coordination: Secondary | ICD-10-CM

## 2017-10-12 NOTE — Therapy (Signed)
[facility] [address], [address] [place], [place], [address] Phone: [phone]   Fax:  [phone]  Physical Therapy Treatment  Patient Details  Name: Mancil Pfenning MRN: [id number] Date of Birth: 1981/05/30 Referring Provider: Dr. Yvonna Alanis   Encounter Date: 10/12/2017  PT End of Session - 10/12/17 0801    Visit Number  17    Date for PT Re-Evaluation  11/08/17    PT Start Time  0800    PT Stop Time  0855    PT Time Calculation (min)  55 min    Activity Tolerance  Patient tolerated treatment well    Behavior During Therapy  [facility] for tasks assessed/performed       History reviewed. No pertinent past medical history.  History reviewed. No pertinent surgical history.  There were no vitals filed for this visit.  Subjective Assessment - 10/12/17 0803    Subjective  No new complaints. Back continues to improve.     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  5-10 min then need a break    Diagnostic tests  muscle biopsy    Patient Stated Goals  Not fall    Currently in Pain?  Yes    Pain Score  3     Pain Location  Back    Pain Orientation  Left;Lower    Pain Descriptors / Indicators  Dull;Sore    Aggravating Factors   Laying down    Pain Relieving Factors  Meds    Multiple Pain Sites  No                       [facility] Adult PT Treatment/Exercise - 10/12/17 0001      Neuro Re-ed    Neuro Re-ed Details   monster walks with green band/diagonal walking 4x 30 feet Standing on black square alt toe taps 20x no UE support      Lumbar Exercises: Stretches   Gastroc Stretch  2 reps;30 seconds off stairs    Other Lumbar Stretch Exercise  psoas doorway stretch 3x10 bil       Lumbar Exercises: Aerobic    Stationary Bike  L3 x 10 min PTA present for status update     UBE (Upper Arm Bike)  Sitting on blue ball for core/posture L6 4x4  PTA present for safety      Lumbar Exercises: Standing   Other Standing Lumbar Exercises  Bil hip abd 5# 2x10     Other Standing Lumbar Exercises  Side stepping green band 10 feet 6x      Lumbar Exercises: Seated   Long Arc Quad on Chair  Strengthening;Both;15 reps;Weights    LAQ on Chair Weights (lbs)  5    Other Seated Lumbar Exercises  Press into red ball with abdominal compressions. 10x VC for posture and forced exhale             PT Education - 10/12/17 0858    Education provided  Yes    Education Details  Discussed resources pt may need in the future including student PTs that can come to his home for ROM/other exercises. Discussion of what resources [employer] program offers to him.     Person(s) Educated  Patient  Comprehension  Verbalized understanding       PT Short Term Goals - 10/06/17 0915      PT SHORT TERM GOAL #1   Title  The patient will demonstrate the ability to activate transverse abdominus muscles and initial HEP for core strengthening    Status  Achieved      PT SHORT TERM GOAL #2   Title  The patient will be able to rise from a chair without UEs or compensatory pushing with the backs of his knees     Status  Achieved      PT SHORT TERM GOAL #3   Title  BERG balance score improved to 55/56 indicating decreased risk of falls     Time  4    Period  Weeks    Status  On-going        PT Long Term Goals - 10/06/17 0915      PT LONG TERM GOAL #1   Title  The patient will be independent with a HEP (Pilates -based) for further strengthening of core muscles    Time  6    Period  Weeks    Status  On-going      PT LONG TERM GOAL #2   Title  The patient will have improved core (lumbo/pelvic/hip) strength improved to 4+/5 needed for walking > 15 minutes at work and for recreational activities hunting/fishing    Time  8     Status  On-going      PT LONG TERM GOAL #3   Title  ascend and descend curb on the Rt without UE support or use of momentum to improve safety with community mobility    Time  6    Status  On-going      PT LONG TERM GOAL #4   Title  FOTO functional outcome score improved from 47% limitation to 36% indicating improved function    Time  6    Period  Weeks    Status  On-going            Plan - 10/12/17 0801    Clinical Impression Statement  Low back pain low level and did not limit treatment today. Pt performed seated, standing LE strengthening exercises in addition to standing dynamic balance exercises. Pt requires close SBA or light CGA for safety during balance activites. Mild fatigue reported after session. Pt reports that he feels more confident walking up and down stairs now and can walk farther before he feels his LE leg fatiguing.  PTA discussed with pt what resources he was aware of that he could use in the future as his needs change. He verbally  knows where to go for help if he needs in the future.     Rehab Potential  Good    Clinical Impairments Affecting Rehab Potential  risk for falls     PT Frequency  2x / week    PT Duration  6 weeks    PT Treatment/Interventions  ADLs/Self Care Home Management;Gait training;Functional mobility training;Therapeutic activities;Therapeutic exercise;Balance training;Patient/family education;Manual techniques;Taping    PT Next Visit Plan  functional mobility, LE strength, core strength;  avoid point of muscular soreness which may exacerbate symptoms    Consulted and Agree with Plan of Care  Patient       Patient will benefit from skilled therapeutic intervention in order to improve the following deficits and impairments:  Abnormal gait, Decreased strength, Decreased balance, Impaired perceived functional ability, Pain  Visit Diagnosis: Muscle weakness (  generalized)  Other lack of coordination  Other abnormalities of gait and  mobility  Acute bilateral low back pain without sciatica     Problem List There are no active problems to display for this patient.   Zayneb Baucum, PTA 10/12/2017, 9:10 AM   [facility] [address], [address] [place], [place], [address] Phone: [phone]   Fax:  [phone]  Name: Neill LoftRobert Behrendt MRN: [id number] Date of Birth: 07/02/1981

## 2017-10-17 ENCOUNTER — Ambulatory Visit: Payer: BLUE CROSS/BLUE SHIELD

## 2017-10-17 DIAGNOSIS — M545 Low back pain, unspecified: Secondary | ICD-10-CM

## 2017-10-17 DIAGNOSIS — M6281 Muscle weakness (generalized): Secondary | ICD-10-CM

## 2017-10-17 DIAGNOSIS — R278 Other lack of coordination: Secondary | ICD-10-CM

## 2017-10-17 DIAGNOSIS — R2689 Other abnormalities of gait and mobility: Secondary | ICD-10-CM

## 2017-10-17 NOTE — Therapy (Signed)
[facility] [address] [place], [place], [address] Phone: [phone]   Fax:  [phone]  Physical Therapy Treatment  Patient Details  Name: Nathan Pierce MRN: [id number] Date of Birth: [birth year] Referring Provider: Dr. Yvonna Alanis   Encounter Date: 10/17/2017  PT End of Session - 10/17/17 0839    Visit Number  18    Date for PT Re-Evaluation  11/08/17    Authorization Type  BCBS- 4 units a day max, no visit limit    PT Start Time  0804    PT Stop Time  0845    PT Time Calculation (min)  41 min    Activity Tolerance  Patient tolerated treatment well    Behavior During Therapy  [facility] for tasks assessed/performed       History reviewed. No pertinent past medical history.  History reviewed. No pertinent surgical history.  There were no vitals filed for this visit.  Subjective Assessment - 10/17/17 0806    Subjective  My weekend was long and tiring.  I drove to [facility] and back so I didn't have a lot of time to stretch.      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  Yes    Pain Score  3     Pain Orientation  Left;Lower    Pain Descriptors / Indicators  Dull;Sore    Pain Type  Chronic pain    Pain Onset  More than a month ago    Pain Frequency  Intermittent    Aggravating Factors   supine    Pain Relieving Factors  medication, rest                       [facility] Adult PT Treatment/Exercise - 10/17/17 0001      Neuro Re-ed    Neuro Re-ed Details   monster walks with green band/diagonal walking 4x 30 feet Standing on black square alt toe taps 20x no UE support      Lumbar Exercises: Stretches   Active Hamstring Stretch  Right;Left;5 reps on 2nd step    Gastroc Stretch  2 reps;30 seconds off stairs      Lumbar Exercises: Aerobic   Stationary Bike  L3 x  10 min PTA present for status update     UBE (Upper Arm Bike)  Sitting on blue ball for core/posture L6 4x4  PT present for safety      Lumbar Exercises: Standing   Other Standing Lumbar Exercises  Bil hip abd 5# 2x10     Other Standing Lumbar Exercises  Side stepping green band 10 feet 6x      Lumbar Exercises: Seated   Long Arc Quad on Chair  Strengthening;Both;15 reps;Weights    LAQ on Chair Weights (lbs)  5               PT Short Term Goals - 10/06/17 0915      PT SHORT TERM GOAL #1   Title  The patient will demonstrate the ability to activate transverse abdominus muscles and initial HEP for core strengthening    Status  Achieved      PT SHORT TERM GOAL #2   Title  The patient will be able to rise from a chair without UEs or compensatory pushing with the backs of  his knees     Status  Achieved      PT SHORT TERM GOAL #3   Title  BERG balance score improved to 55/56 indicating decreased risk of falls     Time  4    Period  Weeks    Status  On-going        PT Long Term Goals - 10/17/17 [id number]      PT LONG TERM GOAL #2   Title  The patient will have improved core (lumbo/pelvic/hip) strength improved to 4+/5 needed for walking > 15 minutes at work and for recreational activities hunting/fishing    Baseline  pt with weak core and slow mobility.      Time  8    Period  Weeks    Status  On-going      PT LONG TERM GOAL #3   Title  ascend and descend curb on the Rt without UE support or use of momentum to improve safety with community mobility    Baseline  max UE support on the Lt, 1 UE support with momentum on the Rt    Time  6    Period  Weeks    Status  On-going            Plan - 10/17/17 0817    Clinical Impression Statement  Pt was not able to exercise over the weekend due to traveling.  Pt performed seated, standing LE strengthening exercises in addition to dynamic standing banalce exercises.  Pt with LE instability and requires close SVA and CGA for  safety with stading exercise.  Pt reports improved confidence with negotiating steps and curbs and has improved endurance with ambulation.  This varies depending on level of fatigue.  Pt will continue to benefit from skilled PT strength, endurance and balance to improve safety at home and in the community.      Rehab Potential  Good    PT Frequency  2x / week    PT Duration  6 weeks    PT Treatment/Interventions  ADLs/Self Care Home Management;Gait training;Functional mobility training;Therapeutic activities;Therapeutic exercise;Balance training;Patient/family education;Manual techniques;Taping    PT Next Visit Plan  functional mobility, LE strength, core strength;  avoid point of muscular soreness which may exacerbate symptoms    Recommended Other Services  cert and recert signed    Consulted and Agree with Plan of Care  Patient       Patient will benefit from skilled therapeutic intervention in order to improve the following deficits and impairments:  Abnormal gait, Decreased strength, Decreased balance, Impaired perceived functional ability, Pain  Visit Diagnosis: Muscle weakness (generalized)  Other lack of coordination  Other abnormalities of gait and mobility  Acute bilateral low back pain without sciatica     Problem List There are no active problems to display for this patient.   Lorrene ReidKelly Regginald Pask, PT 10/17/17 8:42 AM   [facility] [address] [place], [place], [address] Phone: [phone]   Fax:  [phone]  Name: Neill LoftRobert Grigg MRN: [id number] Date of Birth: 01/26/1981

## 2017-10-19 ENCOUNTER — Encounter: Payer: Self-pay | Admitting: Physical Therapy

## 2017-10-19 ENCOUNTER — Ambulatory Visit: Payer: BLUE CROSS/BLUE SHIELD | Admitting: Physical Therapy

## 2017-10-19 DIAGNOSIS — M6281 Muscle weakness (generalized): Secondary | ICD-10-CM

## 2017-10-19 DIAGNOSIS — R2689 Other abnormalities of gait and mobility: Secondary | ICD-10-CM

## 2017-10-19 DIAGNOSIS — M545 Low back pain, unspecified: Secondary | ICD-10-CM

## 2017-10-19 DIAGNOSIS — R278 Other lack of coordination: Secondary | ICD-10-CM

## 2017-10-19 NOTE — Therapy (Signed)
[facility] [address] [place], [place], [address] Phone: [phone]   Fax:  [phone]  Physical Therapy Treatment  Patient Details  Name: Nathan Pierce MRN: [id number] Date of Birth: March 11, 1981 Referring Provider: Dr. Yvonna Alanis   Encounter Date: 10/19/2017  PT End of Session - 10/19/17 0805    Visit Number  19    Date for PT Re-Evaluation  11/08/17    Authorization Type  BCBS- 4 units a day max, no visit limit    PT Start Time  0803    PT Stop Time  0844    PT Time Calculation (min)  41 min    Activity Tolerance  Patient tolerated treatment well    Behavior During Therapy  [facility] for tasks assessed/performed       History reviewed. No pertinent past medical history.  History reviewed. No pertinent surgical history.  There were no vitals filed for this visit.  Subjective Assessment - 10/19/17 0806    Subjective  Just tired from all my travels. My legs have been doing good.    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  -- Lt knee 2/10 , Back sore 3/10                       [facility] Adult PT Treatment/Exercise - 10/19/17 0001      High Level Balance   High Level Balance Activities  -- Marching on black square light UE support 20x, SLS black    High Level Balance Comments  alternating toe taps no UE 10x 2  SBA      Lumbar Exercises: Stretches   Active Hamstring Stretch  Right;Left;5 reps on 2nd step      Lumbar Exercises: Aerobic   Stationary Bike  L3 x 10 min PTA present for status update     UBE (Upper Arm Bike)  Sitting on blue ball for core/posture L4 4x4  PT present for safety, L4 per pt request      Lumbar Exercises: Standing   Other Standing Lumbar Exercises  Side stepping green band 30 feet  6x PTA present      Lumbar Exercises: Seated   Long Arc Quad on Chair  Strengthening;Both;15 reps;Weights    LAQ on Chair Weights (lbs)  5    Other Seated Lumbar Exercises  Press into red ball with abdominal compressions. 10x2  VC for posture and forced exhale      Knee/Hip Exercises: Standing   SLS  Standing on balck pad, light UE to times of no UE, 2x 20-30 sec PTA VC to engage glutes               PT Short Term Goals - 10/06/17 0915      PT SHORT TERM GOAL #1   Title  The patient will demonstrate the ability to activate transverse abdominus muscles and initial HEP for core strengthening    Status  Achieved      PT SHORT TERM GOAL #2   Title  The patient will be able to rise from a chair without UEs or compensatory pushing with the backs of his knees     Status  Achieved      PT SHORT TERM GOAL #3   Title  BERG balance score improved to 55/56 indicating decreased risk of falls     Time  4    Period  Weeks    Status  On-going        PT Long Term Goals - 10/17/17 [id number]      PT LONG TERM GOAL #2   Title  The patient will have improved core (lumbo/pelvic/hip) strength improved to 4+/5 needed for walking > 15 minutes at work and for recreational activities hunting/fishing    Baseline  pt with weak core and slow mobility.      Time  8    Period  Weeks    Status  On-going      PT LONG TERM GOAL #3   Title  ascend and descend curb on the Rt without UE support or use of momentum to improve safety with community mobility    Baseline  max UE support on the Lt, 1 UE support with momentum on the Rt    Time  6    Period  Weeks    Status  On-going            Plan - 10/19/17 0805    Clinical Impression Statement  Pt requires UE support for any single leg balance exercise, for this reason ascending curbs independently without UE support is difficult. This has improved since eval but remains challenge. Pt reports his walking endurance is 15-20 min before needing to sit  down, this is a progression towards his long term walking goal. .     Rehab Potential  Good    Clinical Impairments Affecting Rehab Potential  risk for falls     PT Frequency  2x / week    PT Duration  6 weeks    PT Treatment/Interventions  ADLs/Self Care Home Management;Gait training;Functional mobility training;Therapeutic activities;Therapeutic exercise;Balance training;Patient/family education;Manual techniques;Taping    PT Next Visit Plan  functional mobility, LE strength, core strength;  avoid point of muscular soreness which may exacerbate symptoms    Consulted and Agree with Plan of Care  Patient       Patient will benefit from skilled therapeutic intervention in order to improve the following deficits and impairments:  Abnormal gait, Decreased strength, Decreased balance, Impaired perceived functional ability, Pain  Visit Diagnosis: Muscle weakness (generalized)  Other lack of coordination  Other abnormalities of gait and mobility  Acute bilateral low back pain without sciatica     Problem List There are no active problems to display for this patient.   Mikhail Hallenbeck, PTA 10/19/2017, 8:46 AM  [facility] [address] [place], [place], [address] Phone: [phone]   Fax:  [phone]  Name: Nathan Pierce MRN: [id number] Date of Birth: 03/05/1981

## 2017-10-24 ENCOUNTER — Ambulatory Visit: Payer: BLUE CROSS/BLUE SHIELD

## 2017-10-26 ENCOUNTER — Encounter: Payer: Self-pay | Admitting: Physical Therapy

## 2017-10-26 ENCOUNTER — Ambulatory Visit: Payer: BLUE CROSS/BLUE SHIELD | Admitting: Physical Therapy

## 2017-10-26 DIAGNOSIS — M6281 Muscle weakness (generalized): Secondary | ICD-10-CM

## 2017-10-26 DIAGNOSIS — R278 Other lack of coordination: Secondary | ICD-10-CM

## 2017-10-26 DIAGNOSIS — M545 Low back pain, unspecified: Secondary | ICD-10-CM

## 2017-10-26 DIAGNOSIS — R2689 Other abnormalities of gait and mobility: Secondary | ICD-10-CM

## 2017-10-26 NOTE — Therapy (Addendum)
[facility] [address], [place], [place], [address] Phone: [phone]   Fax:  [phone]  Physical Therapy Treatment  Patient Details  Name: Nathan Pierce MRN: [id number] Date of Birth: October 05, 1980 Referring Provider: Dr. 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  Pt is making progress toward goals and working toward independence with a gym program.  Pt requires assistance and guarding for safety with activity in the clinic with aim to improve strength and stability to improve safety in the community.   See note below for Objective Data and Assessment of Progress/Goals.   Sigurd Sos, PT 10/26/17 9:57 AM     PT End of Session - 10/26/17 0803    Visit Number  20    Date for PT Re-Evaluation  11/08/17    Authorization Type  BCBS- 4 units a day max, no visit limit    PT Start Time  0803    PT Stop Time  0842    PT Time Calculation (min)  39 min    Activity Tolerance  Patient tolerated treatment well    Behavior During Therapy  [facility] for tasks assessed/performed       History reviewed. No pertinent past medical history.  History reviewed. No pertinent surgical history.  There were no vitals filed for this visit.  Subjective Assessment - 10/26/17 0805    Subjective  I just got over the stomach flu. I am very weak and still have no energy. I am just weak all over. My back feels ok.    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  5-10 min then need a break    Diagnostic tests  muscle biopsy    Patient Stated Goals  Not fall    Currently in Pain?  -- Just weak and sore from vomitting    Multiple Pain  Sites  No         [facility] PT Assessment - 10/26/17 0001      Observation/Other Assessments   Focus on Therapeutic Outcomes (FOTO)   32% limitation      Strength   Left Knee Flexion  4+/5    Right Ankle Dorsiflexion  4+/5    Left Ankle Dorsiflexion  4+/5                   [facility] Adult PT Treatment/Exercise - 10/26/17 0001      Lumbar Exercises: Aerobic   Nustep  L1 x 10 min, slow PTA admin FOTO concurrent      Lumbar Exercises: Machines for Strengthening   Leg Press  Seat 7: Bil 130# 2x10 LTLE 30# 2x10, 45# 10x VC for speed & control      Lumbar Exercises: Standing   Other Standing Lumbar Exercises  20# 5x each direction SBA only       Lumbar Exercises: Seated   Long Arc Quad on Chair  Strengthening;Left;3 sets;10 reps;Weights    LAQ on Chair Weights (lbs)  5      Knee/Hip Exercises: Standing   Knee Flexion  Strengthening;Both;1 set;20 reps 2#    Hip Abduction  Stengthening;Both;1 set;20 reps;Knee straight 2#  PT Short Term Goals - 10/06/17 0915      PT SHORT TERM GOAL #1   Title  The patient will demonstrate the ability to activate transverse abdominus muscles and initial HEP for core strengthening    Status  Achieved      PT SHORT TERM GOAL #2   Title  The patient will be able to rise from a chair without UEs or compensatory pushing with the backs of his knees     Status  Achieved      PT SHORT TERM GOAL #3   Title  BERG balance score improved to 55/56 indicating decreased risk of falls     Time  4    Period  Weeks    Status  On-going        PT Long Term Goals - 10/26/17 5573      PT LONG TERM GOAL #1   Title  The patient will be independent with a HEP (Pilates -based) for further strengthening of core muscles    Time  6    Period  Weeks    Status  On-going      PT LONG TERM GOAL #2   Title  The patient will have improved core (lumbo/pelvic/hip) strength improved to 4+/5 needed for walking > 15 minutes at work and for  recreational activities hunting/fishing    Baseline  pt with weak core and slow mobility.      Time  8    Period  Weeks    Status  Partially Met pt can walk 15 min but difficult to measure core strength today d/t just coming off stomach flu and pt is weak feeling all over.       PT LONG TERM GOAL #3   Title  ascend and descend curb on the Rt without UE support or use of momentum to improve safety with community mobility    Baseline  max UE support on the Lt, 1 UE support with momentum on the Rt    Time  6    Period  Weeks    Status  On-going Needs max support of rails.      PT LONG TERM GOAL #4   Title  FOTO functional outcome score improved from 47% limitation to 36% indicating improved function    Baseline  40% limitation    Time  6    Period  Weeks    Status  Achieved 32% limited            Plan - 10/26/17 0804    Clinical Impression Statement  Pt presents today post stomach flu. He is still pretty weak from vomitting. Pt's FOTO is improved, meetin glong term goal and ankle dorsiflexor strength is slightly improved.  Pt requires UE support for stairs.    Rehab Potential  Good    Clinical Impairments Affecting Rehab Potential  risk for falls     PT Frequency  2x / week    PT Duration  6 weeks    PT Treatment/Interventions  ADLs/Self Care Home Management;Gait training;Functional mobility training;Therapeutic activities;Therapeutic exercise;Balance training;Patient/family education;Manual techniques;Taping    PT Next Visit Plan  functional mobility, LE strength, core strength;  avoid point of muscular soreness which may exacerbate symptoms    Consulted and Agree with Plan of Care  Patient       Patient will benefit from skilled therapeutic intervention in order to improve the following deficits and impairments:  Abnormal gait, Decreased strength, Decreased balance, Impaired perceived functional  ability, Pain  Visit Diagnosis: Muscle weakness (generalized)  Other lack of  coordination  Other abnormalities of gait and mobility  Acute bilateral low back pain without sciatica     Problem List There are no active problems to display for this patient.   Myrene Galas, PTA 10/26/17 8:46 AM  [facility] [address] [place], [place], [address] Phone: [phone]   Fax:  [phone]  Name: Lavert Matousek MRN: [id number] Date of Birth: 04-27-1981

## 2017-10-28 ENCOUNTER — Ambulatory Visit: Payer: BLUE CROSS/BLUE SHIELD | Admitting: Physical Therapy

## 2017-10-28 ENCOUNTER — Encounter: Payer: Self-pay | Admitting: Physical Therapy

## 2017-10-28 DIAGNOSIS — M545 Low back pain, unspecified: Secondary | ICD-10-CM

## 2017-10-28 DIAGNOSIS — R2689 Other abnormalities of gait and mobility: Secondary | ICD-10-CM

## 2017-10-28 DIAGNOSIS — M6281 Muscle weakness (generalized): Secondary | ICD-10-CM | POA: Diagnosis not present

## 2017-10-28 DIAGNOSIS — R278 Other lack of coordination: Secondary | ICD-10-CM

## 2017-10-28 NOTE — Therapy (Signed)
[facility] [address] [place], [place], [address] Phone: [phone]   Fax:  [phone]  Physical Therapy Treatment  Patient Details  Name: Nathan Pierce MRN: [id number] Date of Birth: 31-Oct-1980 Referring Provider: Dr. Franchot Mimes   Encounter Date: 10/28/2017  PT End of Session - 10/28/17 0805    Visit Number  21    Date for PT Re-Evaluation  11/08/17    Authorization Type  BCBS- 4 units a day max, no visit limit    PT Start Time  0804    PT Stop Time  0848    PT Time Calculation (min)  44 min    Activity Tolerance  Patient tolerated treatment well    Behavior During Therapy  [facility] for tasks assessed/performed       History reviewed. No pertinent past medical history.  History reviewed. No pertinent surgical history.  There were no vitals filed for this visit.  Subjective Assessment - 10/28/17 0807    Subjective  Still low energy as he canot eat too much. Back is good this AM, i am stretching in bed.     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  5-10 min then need a break    Diagnostic tests  muscle biopsy    Patient Stated Goals  Not fall    Currently in Pain?  No/denies    Multiple Pain Sites  No                       [facility] Adult PT Treatment/Exercise - 10/28/17 0001      Lumbar Exercises: Stretches   Active Hamstring Stretch  Right;Left;3 reps;30 seconds    Lower Trunk Rotation  3 reps;20 seconds bil      Lumbar Exercises: Aerobic   Nustep  L2 x 10 min PTA reviewed status      Lumbar Exercises: Machines for Strengthening   Leg Press  Seat 7: Bil 130# 2x15  LTLE 35# 2x10 VC to control TKE on LT      Lumbar Exercises: Standing   Other Standing Lumbar Exercises  20# 10x  each  direction SBA only       Lumbar Exercises: Seated   Long Arc Quad on Chair  Strengthening;Left;3 sets;10 reps;Weights    LAQ on Chair Weights (lbs)  5               PT Short Term Goals - 10/06/17 0915      PT SHORT TERM GOAL #1   Title  The patient will demonstrate the ability to activate transverse abdominus muscles and initial HEP for core strengthening    Status  Achieved      PT SHORT TERM GOAL #2   Title  The patient will be able to rise from a chair without UEs or compensatory pushing with the backs of his knees     Status  Achieved      PT SHORT TERM GOAL #3   Title  BERG balance score improved to 55/56 indicating decreased risk of falls     Time  4    Period  Weeks    Status  On-going        PT Long Term Goals - 10/26/17 9485  PT LONG TERM GOAL #1   Title  The patient will be independent with a HEP (Pilates -based) for further strengthening of core muscles    Time  6    Period  Weeks    Status  On-going      PT LONG TERM GOAL #2   Title  The patient will have improved core (lumbo/pelvic/hip) strength improved to 4+/5 needed for walking > 15 minutes at work and for recreational activities hunting/fishing    Baseline  pt with weak core and slow mobility.      Time  8    Period  Weeks    Status  Partially Met pt can walk 15 min but difficult to measure core strength today d/t just coming off stomach flu and pt is weak feeling all over.       PT LONG TERM GOAL #3   Title  ascend and descend curb on the Rt without UE support or use of momentum to improve safety with community mobility    Baseline  max UE support on the Lt, 1 UE support with momentum on the Rt    Time  6    Period  Weeks    Status  On-going Needs max support of rails.      PT LONG TERM GOAL #4   Title  FOTO functional outcome score improved from 47% limitation to 36% indicating improved function    Baseline  40% limitation    Time  6    Period  Weeks    Status  Achieved 32% limited             Plan - 10/28/17 0806    Clinical Impression Statement  Improved energy from last session. Pt was able to perform more reps on all his LE strengthening exercises today but we kept the loads the same as they are fairly fatiguing.  No falls. Pt needs cuing when performing leg extensions on the LT to control his TKE.    Rehab Potential  Good    Clinical Impairments Affecting Rehab Potential  risk for falls     PT Frequency  2x / week    PT Duration  6 weeks    PT Next Visit Plan  functional mobility, LE strength, core strength;  avoid point of muscular soreness which may exacerbate symptoms    Consulted and Agree with Plan of Care  Patient       Patient will benefit from skilled therapeutic intervention in order to improve the following deficits and impairments:  Abnormal gait, Decreased strength, Decreased balance, Impaired perceived functional ability, Pain  Visit Diagnosis: Muscle weakness (generalized)  Other lack of coordination  Other abnormalities of gait and mobility  Acute bilateral low back pain without sciatica     Problem List There are no active problems to display for this patient.   Dontray Haberland, PTA 10/28/2017, 8:50 AM  [place] [address] [place], [place], [address] Phone: [phone]   Fax:  [phone]  Name: Nathan Pierce MRN: [id number] Date of Birth: August 25, 1980

## 2017-11-02 ENCOUNTER — Ambulatory Visit: Payer: BLUE CROSS/BLUE SHIELD | Attending: Neurology

## 2017-11-02 DIAGNOSIS — M6281 Muscle weakness (generalized): Secondary | ICD-10-CM | POA: Insufficient documentation

## 2017-11-02 DIAGNOSIS — R278 Other lack of coordination: Secondary | ICD-10-CM | POA: Diagnosis present

## 2017-11-02 DIAGNOSIS — M545 Low back pain, unspecified: Secondary | ICD-10-CM

## 2017-11-02 DIAGNOSIS — R2689 Other abnormalities of gait and mobility: Secondary | ICD-10-CM | POA: Diagnosis present

## 2017-11-02 NOTE — Therapy (Signed)
[facility] [address], [address] [place], [place], [address] Phone: [phone]   Fax:  [phone]  Physical Therapy Treatment  Patient Details  Name: Nathan Pierce MRN: [id number] Date of Birth: 05-Feb-1981 Referring Provider: Dr. Yvonna Alanis   Encounter Date: 11/02/2017  PT End of Session - 11/02/17 0846    Visit Number  22    Date for PT Re-Evaluation  12/30/17    Authorization Type  BCBS- 4 units a day max, no visit limit    PT Start Time  0803    PT Stop Time  0845    PT Time Calculation (min)  42 min    Activity Tolerance  Patient tolerated treatment well    Behavior During Therapy  [facility] for tasks assessed/performed       History reviewed. No pertinent past medical history.  History reviewed. No pertinent surgical history.  There were no vitals filed for this visit.  Subjective Assessment - 11/02/17 0806    Subjective  It is easier to get up from sitting.  My stability and endurance feel like they are improved.      Patient Stated Goals  Not fall, improve endurance, improve strength    Currently in Pain?  No/denies         [facility] PT Assessment - 11/02/17 0001      Assessment   Medical Diagnosis  motor neuron disease      Observation/Other Assessments   Focus on Therapeutic Outcomes (FOTO)   32% limitation      ROM / Strength   AROM / PROM / Strength  Strength      Strength   Overall Strength  Deficits    Strength Assessment Site  Knee    Right Hip Flexion  5/5    Right Hip Extension  5/5    Right Hip ABduction  5/5    Left Hip Extension  4+/5    Left Hip ABduction  4+/5    Right/Left Knee  Right;Left    Right Knee Flexion  5/5    Right Knee Extension  5/5    Left Knee Flexion  4+/5    Left Knee Extension  4+/5    Right Ankle Dorsiflexion  5/5      Transfers   Transfers  Sit to Stand;Stand to Sit    Sit to Stand  With upper extremity assist    Five time sit to stand comments   13.10 minor  use of hands    Stand to Sit  6: Modified independent (Device/Increase time);With upper extremity assist      Ambulation/Gait   Ambulation/Gait  Yes    Ambulation/Gait Assistance  6: Modified independent (Device/Increase time)    Gait Pattern  Step-through pattern;Right genu recurvatum;Left genu recurvatum;Lateral hip instability;Wide base of support    Curb  6: Modified independent (Device/increase time)    Pre-Gait Activities  curb: instability and Rt &Lt genu recurvatum and use of momentum with ascending and descending 4" curb    Gait Comments  right > left genurecurvatum                   [facility] Adult PT Treatment/Exercise - 11/02/17 0001      Lumbar Exercises: Aerobic   Nustep  L2 x 10 min PTA reviewed status      Lumbar Exercises: Machines for Strengthening   Leg Press  Seat 7: Bil 130# 2x15  LTLE 35# 2x10 VC to control TKE on LT  Lumbar Exercises: Standing   Other Standing Lumbar Exercises  20# 10x  each direction SBA only       Lumbar Exercises: Seated   Long Arc Quad on Chair  Strengthening;Left;3 sets;10 reps;Weights    LAQ on Chair Weights (lbs)  5               PT Short Term Goals - 10/06/17 0915      PT SHORT TERM GOAL #1   Title  The patient will demonstrate the ability to activate transverse abdominus muscles and initial HEP for core strengthening    Status  Achieved      PT SHORT TERM GOAL #2   Title  The patient will be able to rise from a chair without UEs or compensatory pushing with the backs of his knees     Status  Achieved      PT SHORT TERM GOAL #3   Title  BERG balance score improved to 55/56 indicating decreased risk of falls     Time  4    Period  Weeks    Status  On-going        PT Long Term Goals - 11/02/17 0807      PT LONG TERM GOAL #1   Title  The patient will be independent with a HEP and transition to the gym for further strengthening of core muscles    Time  8    Period  Weeks    Status  Revised    Target  Date  12/30/17      PT LONG TERM GOAL #2   Title  The patient will have improved core (lumbo/pelvic/hip) strength improved to 4+/5 needed for walking > 15 minutes at work and for recreational activities hunting/fishing    Status  Achieved      PT LONG TERM GOAL #3   Title  ascend and descend curb on the Rt without UE support or use of momentum to improve safety with community mobility    Baseline  Pt able to do without UE support- pt demonstrates instability and use of momentum with this.      Time  8    Period  Weeks    Status  On-going    Target Date  12/30/17      PT LONG TERM GOAL #4   Title  FOTO functional outcome score improved from 47% limitation to 36% indicating improved function    Baseline  40% limitation    Time  8    Period  Weeks    Status  On-going    Target Date  12/30/17      PT LONG TERM GOAL #5   Title  improve LE functional strength to perform 5x sit to stand in < or = to 11.5 seconds without use of UE support    Time  8    Period  Weeks    Status  New    Target Date  12/30/17      Additional Long Term Goals   Additional Long Term Goals  Yes      PT LONG TERM GOAL #6   Title  improve LE strength and stability to walk for 30 minutes in the community without significant limitation    Time  8    Period  Weeks    Status  New    Target Date  12/30/17            Plan - 11/02/17 0830  Clinical Impression Statement  Pt is making steady gains regarding function, endurance and stability of LEs.  Pt with improved ability to perform sit to stand with minimal UE support.  5x sit to stand was 13.1 seconds with minor use of UE support.  Pt is able to ascend and descend a 4" step (curb) without UE support yet technique is unstable and pt uses momentum to ascend the curb.  Pt is limited to walking 15-20 minutes due to instability of LEs and endurance deficits.  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  Pt requires demo and verbal cues to reduce genu  recurvatum with exercise and for technique and safety.  Pt will continue to benefit from skilled PT for LE strength, stability and balance activity.      Rehab Potential  Good    Clinical Impairments Affecting Rehab Potential  risk for falls     PT Frequency  1x / week    PT Duration  8 weeks    PT Treatment/Interventions  ADLs/Self Care Home Management;Gait training;Functional mobility training;Therapeutic activities;Therapeutic exercise;Balance training;Patient/family education;Manual techniques;Taping    PT Next Visit Plan  work on ascending/descending 4" step with control, quad and hip strength.  Plan to transition to the gym over the next 4-5 weeks.    Recommended Other Services  recert sent 11/02/17    Consulted and Agree with Plan of Care  Patient       Patient will benefit from skilled therapeutic intervention in order to improve the following deficits and impairments:  Abnormal gait, Decreased strength, Decreased balance, Impaired perceived functional ability, Pain  Visit Diagnosis: Muscle weakness (generalized) - Plan: PT plan of care cert/re-cert  Other lack of coordination - Plan: PT plan of care cert/re-cert  Other abnormalities of gait and mobility - Plan: PT plan of care cert/re-cert  Acute bilateral low back pain without sciatica - Plan: PT plan of care cert/re-cert     Problem List There are no active problems to display for this patient.    Lorrene Reid, PT 11/02/17 8:47 AM  [facility] [address], [address] [place], [place], [address] Phone: [phone]   Fax:  [phone]  Name: Nathan Pierce MRN: [id number] Date of Birth: [birth year]

## 2017-11-04 ENCOUNTER — Encounter: Payer: Self-pay | Admitting: Physical Therapy

## 2017-11-04 ENCOUNTER — Ambulatory Visit: Payer: BLUE CROSS/BLUE SHIELD | Admitting: Physical Therapy

## 2017-11-04 DIAGNOSIS — M6281 Muscle weakness (generalized): Secondary | ICD-10-CM

## 2017-11-04 DIAGNOSIS — R2689 Other abnormalities of gait and mobility: Secondary | ICD-10-CM

## 2017-11-04 DIAGNOSIS — R278 Other lack of coordination: Secondary | ICD-10-CM

## 2017-11-04 DIAGNOSIS — M545 Low back pain, unspecified: Secondary | ICD-10-CM

## 2017-11-04 NOTE — Therapy (Signed)
[facility] [address], [address] [place], [place], [address] Phone: [phone]   Fax:  [phone]  Physical Therapy Treatment  Patient Details  Name: Nathan Pierce MRN: [id number] Date of Birth: 28-Sep-1980 Referring Provider: Dr. Yvonna Alanis   Encounter Date: 11/04/2017  PT End of Session - 11/04/17 0803    Visit Number  23    Date for PT Re-Evaluation  12/30/17    Authorization Type  BCBS- 4 units a day max, no visit limit    PT Start Time  0801    PT Stop Time  0847    PT Time Calculation (min)  46 min    Activity Tolerance  Patient tolerated treatment well    Behavior During Therapy  [facility] for tasks assessed/performed       History reviewed. No pertinent past medical history.  History reviewed. No pertinent surgical history.  There were no vitals filed for this visit.  Subjective Assessment - 11/04/17 0805    Subjective  Saw MD and PT in [facility] Tuesday. They were pleased with his progress and there were no neurologic progression/regression.     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  No/denies    Multiple Pain Sites  No                       [facility] Adult PT Treatment/Exercise - 11/04/17 0001      Lumbar Exercises: Stretches   Active Hamstring Stretch  Right;Left;2 reps;30 seconds    Gastroc Stretch  Right;Left;2 reps;30 seconds      Lumbar Exercises: Aerobic   Nustep  L2 x 10 min PTA reviewed status      Lumbar Exercises: Machines for Strengthening   Leg Press  Seat 7: Bil 130# 15x, 150# 15x, LTLE 35# 2x10-15      Lumbar Exercises: Standing   Other Standing Lumbar Exercises  25# 10x fwd/bkwrd  VC for control      Lumbar Exercises: Seated   Long Arc Quad on Chair  Strengthening;Left;3 sets;10  reps;Weights    LAQ on Chair Weights (lbs)  5      Knee/Hip Exercises: Standing   Stairs  2x up the stairs: Bil rails mainly for ascending, demonstrated improved descent.               PT Short Term Goals - 10/06/17 0915      PT SHORT TERM GOAL #1   Title  The patient will demonstrate the ability to activate transverse abdominus muscles and initial HEP for core strengthening    Status  Achieved      PT SHORT TERM GOAL #2   Title  The patient will be able to rise from a chair without UEs or compensatory pushing with the backs of his knees     Status  Achieved      PT SHORT TERM GOAL #3   Title  BERG balance score improved to 55/56 indicating decreased risk of falls     Time  4    Period  Weeks    Status  On-going        PT Long Term Goals - 11/02/17 6962      PT LONG TERM GOAL #1   Title  The patient will  be independent with a HEP and transition to the gym for further strengthening of core muscles    Time  8    Period  Weeks    Status  Revised    Target Date  12/30/17      PT LONG TERM GOAL #2   Title  The patient will have improved core (lumbo/pelvic/hip) strength improved to 4+/5 needed for walking > 15 minutes at work and for recreational activities hunting/fishing    Status  Achieved      PT LONG TERM GOAL #3   Title  ascend and descend curb on the Rt without UE support or use of momentum to improve safety with community mobility    Baseline  Pt able to do without UE support- pt demonstrates instability and use of momentum with this.      Time  8    Period  Weeks    Status  On-going    Target Date  12/30/17      PT LONG TERM GOAL #4   Title  FOTO functional outcome score improved from 47% limitation to 36% indicating improved function    Baseline  40% limitation    Time  8    Period  Weeks    Status  On-going    Target Date  12/30/17      PT LONG TERM GOAL #5   Title  improve LE functional strength to perform 5x sit to stand in < or = to 11.5  seconds without use of UE support    Time  8    Period  Weeks    Status  New    Target Date  12/30/17      Additional Long Term Goals   Additional Long Term Goals  Yes      PT LONG TERM GOAL #6   Title  improve LE strength and stability to walk for 30 minutes in the community without significant limitation    Time  8    Period  Weeks    Status  New    Target Date  12/30/17            Plan - 11/04/17 0803    Clinical Impression Statement  Pt saw his MD and PT in [place] this past week. There were no progressions of his ALS so they were happy/pleased. Pt tolerated increases in his leg press and resisted walking with mild fatigue. He was reminded to not overwork himself.  Pt struggled with ascending stairs after his prior quad work but he did demonstrate better control descending the stairs.     Rehab Potential  Good    Clinical Impairments Affecting Rehab Potential  risk for falls     PT Frequency  1x / week    PT Duration  8 weeks    PT Treatment/Interventions  ADLs/Self Care Home Management;Gait training;Functional mobility training;Therapeutic activities;Therapeutic exercise;Balance training;Patient/family education;Manual techniques;Taping    PT Next Visit Plan  work on ascending/descending 4" step with control, quad and hip strength.  Plan to transition to the gym over the next 4-5 weeks.    Consulted and Agree with Plan of Care  Patient       Patient will benefit from skilled therapeutic intervention in order to improve the following deficits and impairments:  Abnormal gait, Decreased strength, Decreased balance, Impaired perceived functional ability, Pain  Visit Diagnosis: Muscle weakness (generalized)  Other lack of coordination  Other abnormalities of gait and mobility  Acute bilateral low back  pain without sciatica     Problem List There are no active problems to display for this patient.   COCHRAN,JENNIFER, PTA 11/04/2017, 8:49 AM  [facility] [address] [place], [place], [address] Phone: [phone]   Fax:  [phone]  Name: Nathan Pierce MRN: [id number] Date of Birth: [birth year]

## 2017-11-21 ENCOUNTER — Encounter: Payer: BLUE CROSS/BLUE SHIELD | Admitting: Physical Therapy

## 2017-11-29 ENCOUNTER — Encounter (INDEPENDENT_AMBULATORY_CARE_PROVIDER_SITE_OTHER): Payer: Self-pay

## 2017-11-30 ENCOUNTER — Ambulatory Visit: Payer: BLUE CROSS/BLUE SHIELD | Admitting: Physical Therapy

## 2017-11-30 ENCOUNTER — Encounter: Payer: Self-pay | Admitting: Physical Therapy

## 2017-11-30 DIAGNOSIS — R2689 Other abnormalities of gait and mobility: Secondary | ICD-10-CM

## 2017-11-30 DIAGNOSIS — M6281 Muscle weakness (generalized): Secondary | ICD-10-CM

## 2017-11-30 DIAGNOSIS — M545 Low back pain, unspecified: Secondary | ICD-10-CM

## 2017-11-30 DIAGNOSIS — R278 Other lack of coordination: Secondary | ICD-10-CM

## 2017-11-30 NOTE — Therapy (Signed)
[facility] [address] [place], [place], [address] Phone: [phone]   Fax:  [phone]  Physical Therapy Treatment  Patient Details  Name: Nathan Pierce MRN: [id number] Date of Birth: [birth year] Referring Provider: Dr. Yvonna Alanis   Encounter Date: 11/30/2017  PT End of Session - 11/30/17 0801    Visit Number  24    Date for PT Re-Evaluation  12/30/17    Authorization Type  BCBS- 4 units a day max, no visit limit    PT Start Time  0800    PT Stop Time  0845    PT Time Calculation (min)  45 min    Activity Tolerance  Patient tolerated treatment well    Behavior During Therapy  [facility] for tasks assessed/performed       History reviewed. No pertinent past medical history.  History reviewed. No pertinent surgical history.  There were no vitals filed for this visit.  Subjective Assessment - 11/30/17 0803    Subjective  Pt has just returned from work Firefighter trips. He fell 2x but he describes the falls as more poor judgment rather than falling from LE weakness. His LT knee is sore laterally from one fall.     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  5-10 min then need a break    Diagnostic tests  muscle biopsy    Patient Stated Goals  Not fall, improve endurance, improve strength    Currently in Pain?  Yes    Pain Score  2     Pain Location  Knee    Pain Orientation  Left;Lateral    Pain Descriptors / Indicators  Sore    Aggravating Factors   Tender from fall/twist    Pain Relieving Factors  not stretching it.    Multiple Pain Sites  No                       [facility] Adult PT Treatment/Exercise - 11/30/17 0001      Lumbar Exercises: Stretches   Active Hamstring Stretch  Right;Left;3  reps;30 seconds Used yoga strap for modification      Lumbar Exercises: Aerobic   Nustep  L2 x 10 min PTA reviewed status      Lumbar Exercises: Machines for Strengthening   Leg Press  Seat 7: Bil 150# 2x15, 35# Single LTLE 2x10-15 VC to slow eccentrics      Lumbar Exercises: Standing   Other Standing Lumbar Exercises  25# 10x fwd/bkwrd  VC for control      Lumbar Exercises: Seated   Long Arc Quad on Chair  Strengthening;Both;3 sets;10 reps;Weights    LAQ on Chair Weights (lbs)  5    Other Seated Lumbar Exercises  Sit to stand 5x = 13 sec               PT Short Term Goals - 10/06/17 0915      PT SHORT TERM GOAL #1   Title  The patient will demonstrate the ability to activate transverse abdominus muscles and initial HEP for core strengthening    Status  Achieved      PT SHORT TERM GOAL #2   Title  The patient will be able to rise from a  chair without UEs or compensatory pushing with the backs of his knees     Status  Achieved      PT SHORT TERM GOAL #3   Title  BERG balance score improved to 55/56 indicating decreased risk of falls     Time  4    Period  Weeks    Status  On-going        PT Long Term Goals - 11/30/17 0806      PT LONG TERM GOAL #1   Title  The patient will be independent with a HEP and transition to the gym for further strengthening of core muscles    Time  8    Period  Weeks    Status  On-going            Plan - 11/30/17 0802    Clinical Impression Statement  Pt just returned from about a month of work and vacation travel. During his absence he reports 2 falls, one from misjudging his step out of his trunk and one from being inebriated. Pt was not limited in his walking per report during his vacation.  Pt scored 13 seconds on 5x sit to stand.      Rehab Potential  Good    Clinical Impairments Affecting Rehab Potential  risk for falls     PT Frequency  1x / week    PT Duration  8 weeks    PT Treatment/Interventions  ADLs/Self Care Home  Management;Gait training;Functional mobility training;Therapeutic activities;Therapeutic exercise;Balance training;Patient/family education;Manual techniques;Taping    PT Next Visit Plan  work on ascending/descending 4" step with control, quad and hip strength.  Plan to transition to the gym over the next 4-5 weeks.    Consulted and Agree with Plan of Care  Patient       Patient will benefit from skilled therapeutic intervention in order to improve the following deficits and impairments:  Abnormal gait, Decreased strength, Decreased balance, Impaired perceived functional ability, Pain  Visit Diagnosis: Muscle weakness (generalized)  Other lack of coordination  Other abnormalities of gait and mobility  Acute bilateral low back pain without sciatica     Problem List There are no active problems to display for this patient.   Justine Cossin, PTA 11/30/2017, 8:45 AM   [facility] [address] [place], [place], [address] Phone: [phone]   Fax:  [phone]  Name: Nathan Pierce MRN: [id number] Date of Birth: May 17, 1981

## 2017-12-05 ENCOUNTER — Ambulatory Visit: Payer: BLUE CROSS/BLUE SHIELD | Attending: Neurology

## 2017-12-05 DIAGNOSIS — M545 Low back pain, unspecified: Secondary | ICD-10-CM

## 2017-12-05 DIAGNOSIS — R2689 Other abnormalities of gait and mobility: Secondary | ICD-10-CM | POA: Diagnosis present

## 2017-12-05 DIAGNOSIS — R278 Other lack of coordination: Secondary | ICD-10-CM | POA: Diagnosis present

## 2017-12-05 DIAGNOSIS — M6281 Muscle weakness (generalized): Secondary | ICD-10-CM | POA: Diagnosis present

## 2017-12-05 NOTE — Therapy (Signed)
[facility] [address], [address] [place], [place], [address] Phone: [phone]   Fax:  [phone]  Physical Therapy Treatment  Patient Details  Name: Nathan LoftRobert Selvidge MRN: [id number] Date of Birth: 07/31/1980 Referring Provider: Dr. Yvonna Alanisichard Bedlack   Encounter Date: 12/05/2017  PT End of Session - 12/05/17 0835    Visit Number  25    Date for PT Re-Evaluation  12/30/17    Authorization Type  BCBS- 4 units a day max, no visit limit    PT Start Time  0800    PT Stop Time  0841    PT Time Calculation (min)  41 min    Activity Tolerance  Patient tolerated treatment well    Behavior During Therapy  [facility] for tasks assessed/performed       History reviewed. No pertinent past medical history.  History reviewed. No pertinent surgical history.  There were no vitals filed for this visit.  Subjective Assessment - 12/05/17 0804    Subjective  No falls since he was here last.      Currently in Pain?  Yes    Pain Score  2     Pain Location  Knee    Pain Orientation  Left    Pain Descriptors / Indicators  Sore    Pain Type  Chronic pain    Pain Onset  More than a month ago    Pain Frequency  Intermittent    Aggravating Factors   activity    Pain Relieving Factors  non weight bearing                       [facility] Adult PT Treatment/Exercise - 12/05/17 0001      Lumbar Exercises: Stretches   Active Hamstring Stretch  Right;Left;3 reps;30 seconds Used yoga strap for modification      Lumbar Exercises: Aerobic   Nustep  L2 x 10 min PT reviewed status      Lumbar Exercises: Machines for Strengthening   Leg Press  Seat 7: Bil 150# 2x15, 35# Single Lt LE 2x10-15 VC to slow eccentrics      Lumbar Exercises: Standing   Other Standing Lumbar Exercises  25# 10x fwd/bkwrd  VC for control      Lumbar Exercises: Seated   Long Arc Quad on Chair  Strengthening;Both;3 sets;10 reps;Weights    LAQ on Chair Weights (lbs)  5     Other Seated Lumbar Exercises  Sit to stand 5x      Knee/Hip Exercises: Standing   Forward Step Up  Both;1 set;10 reps;Step Height: 6";Hand Hold: 2;Limitations               PT Short Term Goals - 10/06/17 0915      PT SHORT TERM GOAL #1   Title  The patient will demonstrate the ability to activate transverse abdominus muscles and initial HEP for core strengthening    Status  Achieved      PT SHORT TERM GOAL #2   Title  The patient will be able to rise from a chair without UEs or compensatory pushing with the backs of his knees     Status  Achieved      PT SHORT TERM GOAL #3   Title  BERG balance score improved to 55/56 indicating decreased risk of falls     Time  4    Period  Weeks    Status  On-going  PT Long Term Goals - 12/05/17 0805      PT LONG TERM GOAL #2   Title  The patient will have improved core (lumbo/pelvic/hip) strength improved to 4+/5 needed for walking > 15 minutes at work and for recreational activities hunting/fishing    Baseline  pt with weak core and slow mobility.      Status  Achieved      PT LONG TERM GOAL #3   Title  ascend and descend curb on the Rt without UE support or use of momentum to improve safety with community mobility    Baseline  Pt able to do without UE support- pt demonstrates instability and use of momentum with this.      Time  8    Period  Weeks    Status  On-going            Plan - 12/05/17 0807    Clinical Impression Statement  Pt reports that he is doing more steps due to living in a new house.  Pt is inconsistent with HEP for strength and is doing stretching regularly.  Pt has not had falls since last session.  Pt was able to increase weight on the leg press today.  Pt with continued functional weakness due to pre ALS and demonstrates instability with negotiating steps.  Pt will continue to benefit from skilled PT for strength, flexibility and endurance to improve safety.      Rehab Potential  Good    PT  Frequency  1x / week    PT Duration  8 weeks    PT Treatment/Interventions  ADLs/Self Care Home Management;Gait training;Functional mobility training;Therapeutic activities;Therapeutic exercise;Balance training;Patient/family education;Manual techniques;Taping    PT Next Visit Plan  work on ascending/descending 4" step with control, quad and hip strength.  Plan to transition to the gym by the end of June.      Consulted and Agree with Plan of Care  Patient       Patient will benefit from skilled therapeutic intervention in order to improve the following deficits and impairments:  Abnormal gait, Decreased strength, Decreased balance, Impaired perceived functional ability, Pain  Visit Diagnosis: Muscle weakness (generalized)  Other lack of coordination  Other abnormalities of gait and mobility  Acute bilateral low back pain without sciatica     Problem List There are no active problems to display for this patient.    Lorrene Reid, PT 12/05/17 8:39 AM  [facility] [address] [place], [place], [address] Phone: [phone]   Fax:  [phone]  Name: Swan Zayed MRN: [id number] Date of Birth: May 10, 1981

## 2017-12-14 ENCOUNTER — Ambulatory Visit: Payer: BLUE CROSS/BLUE SHIELD | Admitting: Physical Therapy

## 2017-12-14 ENCOUNTER — Encounter: Payer: Self-pay | Admitting: Physical Therapy

## 2017-12-14 DIAGNOSIS — M6281 Muscle weakness (generalized): Secondary | ICD-10-CM

## 2017-12-14 DIAGNOSIS — R278 Other lack of coordination: Secondary | ICD-10-CM

## 2017-12-14 DIAGNOSIS — M545 Low back pain, unspecified: Secondary | ICD-10-CM

## 2017-12-14 DIAGNOSIS — R2689 Other abnormalities of gait and mobility: Secondary | ICD-10-CM

## 2017-12-14 NOTE — Therapy (Addendum)
[facility] [address], [place], [place], [address] Phone: [phone]   Fax:  [phone]  Physical Therapy Treatment  Patient Details  Name: Nathan Pierce MRN: [id number] Date of Birth: 01-25-1981 Referring Provider: Dr. Franchot Mimes   Encounter Date: 12/14/2017  PT End of Session - 12/14/17 0801    Visit Number  26    Date for PT Re-Evaluation  12/30/17    Authorization Type  [employer]- 4 units a day max, no visit limit    PT Start Time  0800    PT Stop Time  0840    PT Time Calculation (min)  40 min    Activity Tolerance  Patient tolerated treatment well    Behavior During Therapy  [facility] for tasks assessed/performed       History reviewed. No pertinent past medical history.  History reviewed. No pertinent surgical history.  There were no vitals filed for this visit.  Subjective Assessment - 12/14/17 0802    Subjective  No pain. I have been moving into my new house. I do feel fatigued this AM. Today has to be my last day due to my schedule.    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  No/denies    Multiple Pain Sites  No         [facility] PT Assessment - 12/14/17 0001      Observation/Other Assessments   Focus on Therapeutic Outcomes (FOTO)   39% limitation      Strength   Strength Assessment Site  Knee    Right Hip Flexion  5/5    Right Hip Extension  5/5    Right Hip ABduction  5/5    Left Hip Extension  5/5    Left Hip ABduction  5/5    Right Knee Flexion  5/5    Right Knee Extension  5/5    Left Knee Flexion  5/5    Left Knee Extension  5/5    Right Ankle Dorsiflexion  5/5    Left Ankle Plantar Flexion  -- Cannot heel lift on LTLE      Berg Balance Test   Sit to Stand  Able to stand without using hands and stabilize independently    Standing Unsupported  Able  to stand safely 2 minutes    Sitting with Back Unsupported but Feet Supported on Floor or Stool  Able to sit safely and securely 2 minutes    Stand to Sit  Sits safely with minimal use of hands    Transfers  Able to transfer safely, minor use of hands    Standing Unsupported with Eyes Closed  Able to stand 10 seconds safely    Standing Ubsupported with Feet Together  Able to place feet together independently and stand 1 minute safely    From Standing, Reach Forward with Outstretched Arm  Can reach confidently >25 cm (10")    From Standing Position, Pick up Object from Floor  Able to pick up shoe safely and easily    From Standing Position, Turn to Look Behind Over each Shoulder  Looks behind from both sides and weight shifts well    Turn 360 Degrees  Able to turn 360 degrees safely in 4 seconds or less    Standing Unsupported, Alternately Place Feet on Step/Stool  Able to  stand independently and safely and complete 8 steps in 20 seconds    Standing Unsupported, One Foot in [place] to take small step independently and hold 30 seconds    Standing on One Leg  Able to lift leg independently and hold 5-10 seconds    Total Score  53                   [facility] Adult PT Treatment/Exercise - 12/14/17 0001      Lumbar Exercises: Stretches   Active Hamstring Stretch  Right;Left;3 reps;30 seconds Used yoga strap for modification    Piriformis Stretch  Left;2 reps;30 seconds      Lumbar Exercises: Aerobic   Stationary Bike  L2 x 8 min PTA present for status      Lumbar Exercises: Machines for Strengthening   Leg Press  Seat 7: Bil 150# 2x15, 35# Single Lt LE 2x10-15 VC to slow eccentrics      Lumbar Exercises: Standing   Other Standing Lumbar Exercises  Hip abduction 20x 0#      Lumbar Exercises: Seated   Long Arc Quad on Chair  Strengthening;Both;3 sets;10 reps;Weights    LAQ on Chair Weights (lbs)  4 Lessened weight for fatigue    Other Seated Lumbar Exercises  Sit to stand 11 sec  minor use of hands               PT Short Term Goals - 12/14/17 0830      PT SHORT TERM GOAL #3   Title  BERG balance score improved to 55/56 indicating decreased risk of falls     Time  4    Period  Weeks    Status  Not Met 53/56        PT Long Term Goals - 12/14/17 0830      PT LONG TERM GOAL #1   Title  The patient will be independent with a HEP and transition to the gym for further strengthening of core muscles    Time  8    Period  Weeks    Status  Achieved      PT LONG TERM GOAL #2   Title  The patient will have improved core (lumbo/pelvic/hip) strength improved to 4+/5 needed for walking > 15 minutes at work and for recreational activities hunting/fishing    Time  8    Period  Weeks    Status  Achieved      PT LONG TERM GOAL #3   Title  ascend and descend curb on the Rt without UE support or use of momentum to improve safety with community mobility    Baseline  Pt able to do without UE support- pt demonstrates instability and use of momentum with this.      Time  8    Period  Weeks    Status  Not Met      PT LONG TERM GOAL #4   Title  FOTO functional outcome score improved from 47% limitation to 36% indicating improved function    Time  8    Period  Weeks    Status  Not Met 39% limitation            Plan - 12/14/17 0801    Clinical Impression Statement  Today will be pt's last session due to his work schedule in the next two weeks. His final FOTO score was 39% limitation, not meeting the goal but yet improved from evaluation.  Strength of  bilateral hips and knees also improved since eval, note LT ankle cannot plantarflex in standing. He continues to have difficulties negotiating stairs requiring the use of the rails for both direction. He intends to join the gym when he returns from his next vacation.  He is walking longer with less LE fatigue. BERG balance score ends with 53/56 as single leg stance and tandem stance continue to be very challenging.      Rehab Potential  Good    Clinical Impairments Affecting Rehab Potential  risk for falls     PT Frequency  1x / week    PT Duration  8 weeks    PT Treatment/Interventions  ADLs/Self Care Home Management;Gait training;Functional mobility training;Therapeutic activities;Therapeutic exercise;Balance training;Patient/family education;Manual techniques;Taping    PT Next Visit Plan  Discharge to today per pt request.     Consulted and Agree with Plan of Care  Patient       Patient will benefit from skilled therapeutic intervention in order to improve the following deficits and impairments:  Abnormal gait, Decreased strength, Decreased balance, Impaired perceived functional ability, Pain  Visit Diagnosis: Muscle weakness (generalized)  Other lack of coordination  Other abnormalities of gait and mobility  Acute bilateral low back pain without sciatica     Problem List There are no active problems to display for this patient.   Myrene Galas, PTA 12/14/17 8:49 AM  PHYSICAL THERAPY DISCHARGE SUMMARY  Visits from Start of Care: 26  Current functional level related to goals / functional outcomes: See above for final status and strength.     Remaining deficits: Pt has HEP in place to address remaining deficits.  Pt will go to the gym as able.    Education / Equipment: HEP Plan: Patient agrees to discharge.  Patient goals were partially met. Patient is being discharged due to being pleased with the current functional level.  ?????        Sigurd Sos, PT 12/14/17 12:02 PM  [facility] [address] [place], [place], [address] Phone: [phone]   Fax:  [phone]  Name: Nathan Pierce MRN: [id number] Date of Birth: 16-Mar-1981

## 2017-12-19 ENCOUNTER — Encounter: Payer: BLUE CROSS/BLUE SHIELD | Admitting: Physical Therapy

## 2019-01-29 ENCOUNTER — Ambulatory Visit (INDEPENDENT_AMBULATORY_CARE_PROVIDER_SITE_OTHER): Payer: BC Managed Care – PPO | Admitting: Otolaryngology

## 2019-01-29 ENCOUNTER — Other Ambulatory Visit: Payer: Self-pay

## 2019-01-29 DIAGNOSIS — H60333 Swimmer's ear, bilateral: Secondary | ICD-10-CM

## 2019-01-29 DIAGNOSIS — H900 Conductive hearing loss, bilateral: Secondary | ICD-10-CM

## 2019-02-12 ENCOUNTER — Ambulatory Visit (INDEPENDENT_AMBULATORY_CARE_PROVIDER_SITE_OTHER): Payer: BC Managed Care – PPO | Admitting: Otolaryngology

## 2019-02-12 ENCOUNTER — Other Ambulatory Visit: Payer: Self-pay

## 2019-02-12 DIAGNOSIS — H60332 Swimmer's ear, left ear: Secondary | ICD-10-CM

## 2019-02-12 DIAGNOSIS — H9012 Conductive hearing loss, unilateral, left ear, with unrestricted hearing on the contralateral side: Secondary | ICD-10-CM | POA: Diagnosis not present

## 2019-05-07 ENCOUNTER — Other Ambulatory Visit: Payer: Self-pay | Admitting: *Deleted

## 2019-05-07 DIAGNOSIS — Z20822 Contact with and (suspected) exposure to covid-19: Secondary | ICD-10-CM

## 2019-05-09 ENCOUNTER — Other Ambulatory Visit: Payer: Self-pay

## 2019-05-09 DIAGNOSIS — Z20822 Contact with and (suspected) exposure to covid-19: Secondary | ICD-10-CM

## 2019-05-09 LAB — NOVEL CORONAVIRUS, NAA: SARS-CoV-2, NAA: NOT DETECTED

## 2019-05-10 LAB — NOVEL CORONAVIRUS, NAA: SARS-CoV-2, NAA: NOT DETECTED

## 2019-06-04 ENCOUNTER — Other Ambulatory Visit: Payer: Self-pay

## 2019-06-04 ENCOUNTER — Ambulatory Visit (INDEPENDENT_AMBULATORY_CARE_PROVIDER_SITE_OTHER): Payer: BC Managed Care – PPO | Admitting: Otolaryngology

## 2019-06-04 DIAGNOSIS — J343 Hypertrophy of nasal turbinates: Secondary | ICD-10-CM | POA: Diagnosis not present

## 2019-06-04 DIAGNOSIS — J320 Chronic maxillary sinusitis: Secondary | ICD-10-CM | POA: Diagnosis not present

## 2019-06-04 DIAGNOSIS — H93293 Other abnormal auditory perceptions, bilateral: Secondary | ICD-10-CM | POA: Diagnosis not present

## 2019-08-05 ENCOUNTER — Ambulatory Visit
Admission: EM | Admit: 2019-08-05 | Discharge: 2019-08-05 | Disposition: A | Discharge status: Home / Self Care | Payer: BC Managed Care – PPO | Attending: Emergency Medicine | Admitting: Emergency Medicine

## 2019-08-05 ENCOUNTER — Other Ambulatory Visit: Payer: Self-pay

## 2019-08-05 DIAGNOSIS — R300 Dysuria: Secondary | ICD-10-CM

## 2019-08-05 DIAGNOSIS — N3001 Acute cystitis with hematuria: Secondary | ICD-10-CM | POA: Diagnosis present

## 2019-08-05 HISTORY — DX: Essential (primary) hypertension: I10

## 2019-08-05 HISTORY — DX: Hyperlipidemia, unspecified: E78.5

## 2019-08-05 HISTORY — DX: Amyotrophic lateral sclerosis: G12.21

## 2019-08-05 LAB — POCT URINALYSIS DIP (MANUAL ENTRY)
Glucose, UA: NEGATIVE mg/dL
Nitrite, UA: NEGATIVE
Protein Ur, POC: 30 mg/dL — AB
Spec Grav, UA: 1.025 (ref 1.010–1.025)
Urobilinogen, UA: 1 E.U./dL
pH, UA: 6 (ref 5.0–8.0)

## 2019-08-05 MED ORDER — PHENAZOPYRIDINE HCL 100 MG PO TABS: 100.0000 mg | ORAL_TABLET | Freq: Three times a day (TID) | ORAL | 0 refills | Status: AC | PRN

## 2019-08-05 MED ORDER — SULFAMETHOXAZOLE-TRIMETHOPRIM 800-160 MG PO TABS: 1.0000 | ORAL_TABLET | Freq: Two times a day (BID) | ORAL | 0 refills | Status: AC

## 2019-08-05 NOTE — ED Triage Notes (Signed)
Pt presents to UC w/ c/o frequent urination, burning on urination, pain at base of penis since yesterday morning.

## 2019-08-05 NOTE — Discharge Instructions (Addendum)
POC urine analysis showed moderate amount of red blood cell and leukocyte, this is consistent with UTI Urine culture sent.  We will call you with the results.   Push fluids and get plenty of rest.   Take antibiotic as directed and to completion Take pyridium as prescribed and as needed for symptomatic relief Follow up with PCP if symptoms persists Return here or go to ER if you have any new or worsening symptoms such as fever, worsening abdominal pain, nausea/vomiting, flank pain

## 2019-08-05 NOTE — ED Provider Notes (Signed)
[facility]    CSN: [id number] Arrival date & time: 08/05/19  1007      History   Chief Complaint Chief Complaint  Patient presents with  . Urinary sxs    HPI Nathan Pierce is a 39 y.o. male.   Who presents to the urgent care with a complaint of frequent urination, dysuria that began 1 day ago.  He denies a precipitating event or recent sexual encounter.  He has tried OTC medication without relief.  His symptoms are made worse with urination.  He denies similar symptoms in the past.  He complains of decreased amount, increased urgency.  He denies fever, chills, nausea, vomiting, abdominal pain, flank pain, hematuria, or incontinence  The history is provided by the patient. No language interpreter was used.    Past Medical History:  Diagnosis Date  . ALS (amyotrophic lateral sclerosis) ([place])   . Hyperlipidemia   . Hypertension     There are no problems to display for this patient.   Past Surgical History:  Procedure Laterality Date  . CHOLECYSTECTOMY    . LUMBAR PUNCTURE    . TONSILLECTOMY         Home Medications    Prior to Admission medications   Medication Sig Start Date End Date Taking? Authorizing Provider  rosuvastatin (CRESTOR) 10 MG tablet Take 10 mg by mouth daily.   Yes [provider]  metoprolol tartrate (LOPRESSOR) 25 MG tablet Take 25 mg by mouth 2 (two) times daily.    [provider]  phenazopyridine (PYRIDIUM) 100 MG tablet Take 1 tablet (100 mg total) by mouth 3 (three) times daily as needed for pain. 08/05/19   Ercel Pepitone, Darrelyn Hillock, FNP  riluzole (RILUTEK) 50 MG tablet Take 50 mg by mouth every 12 (twelve) hours.    [provider]  sulfamethoxazole-trimethoprim (BACTRIM DS) 800-160 MG tablet Take 1 tablet by mouth 2 (two) times daily for 7 days. 08/05/19 08/12/19  Emerson Monte, FNP    Family History Family History  Problem Relation Age of Onset  . Healthy Mother   . Healthy Father     Social  History Social History   Tobacco Use  . Smoking status: Never Smoker  . Smokeless tobacco: Never Used  Substance Use Topics  . Alcohol use: Yes    Comment: occ  . Drug use: Not on file     Allergies   Patient has no known allergies.   Review of Systems Review of Systems  Constitutional: Negative.   Respiratory: Negative.   Cardiovascular: Negative.   Gastrointestinal: Negative.   Genitourinary: Positive for dysuria, frequency and penile pain.  All other systems reviewed and are negative.    Physical Exam Triage Vital Signs ED Triage Vitals  Enc Vitals Group     BP 08/05/19 1016 (!) 143/96     Pulse Rate 08/05/19 1016 (!) 114     Resp 08/05/19 1016 18     Temp 08/05/19 1016 98.9 F (37.2 C)     Temp Source 08/05/19 1014 Oral     SpO2 08/05/19 1016 95 %     Weight --      Height --      Head Circumference --      Peak Flow --      Pain Score 08/05/19 1019 0     Pain Loc --      Pain Edu? --      Excl. in [place]? --    No data found.  Updated Vital Signs BP (!) 143/96 (BP Location: Right Arm)   Pulse (!) 114   Temp 98.9 F (37.2 C) (Oral)   Resp 18   SpO2 95%   Visual Acuity Right Eye Distance:   Left Eye Distance:   Bilateral Distance:    Right Eye Near:   Left Eye Near:    Bilateral Near:     Physical Exam Vitals and nursing note reviewed.  Constitutional:      General: He is not in acute distress.    Appearance: Normal appearance. He is normal weight. He is not ill-appearing, toxic-appearing or diaphoretic.  Cardiovascular:     Rate and Rhythm: Normal rate and regular rhythm.     Pulses: Normal pulses.     Heart sounds: Normal heart sounds. No murmur.  Pulmonary:     Effort: Pulmonary effort is normal. No respiratory distress.     Breath sounds: Normal breath sounds. No wheezing or rales.  Chest:     Chest wall: No tenderness.  Abdominal:     General: Abdomen is flat. Bowel sounds are normal. There is no distension.     Palpations:  Abdomen is soft. There is no mass.     Tenderness: There is no abdominal tenderness. There is no right CVA tenderness, left CVA tenderness, guarding or rebound.     Hernia: No hernia is present.  Neurological:     Mental Status: He is alert.      UC Treatments / Results  Labs (all labs ordered are listed, but only abnormal results are displayed) Labs Reviewed  POCT URINALYSIS DIP (MANUAL ENTRY) - Abnormal; Notable for the following components:      Result Value   Bilirubin, UA small (*)    Ketones, POC UA trace (5) (*)    Blood, UA moderate (*)    Protein Ur, POC =30 (*)    Leukocytes, UA Moderate (2+) (*)    All other components within normal limits  URINE CULTURE    EKG   Radiology No results found.  Procedures Procedures (including critical care time)  Medications Ordered in UC Medications - No data to display  Initial Impression / Assessment and Plan / UC Course  I have reviewed the triage vital signs and the nursing notes.  Pertinent labs & imaging results that were available during my care of the patient were reviewed by me and considered in my medical decision making (see chart for details).   POC urine analysis test was ordered and result was reviewed.  Results  show moderate amount of red blood cell and  leukocyte.  Patient will be treated for possible UTI  Bactrim DS will be prescribed Pyridium will be prescribed Advised patient to increase fluid intake To return for worsening of symptoms  Final Clinical Impressions(s) / UC Diagnoses   Final diagnoses:  Dysuria  Acute cystitis with hematuria     Discharge Instructions     Urine culture sent.  We will call you with the results.   Push fluids and get plenty of rest.   Take antibiotic as directed and to completion Take pyridium as prescribed and as needed for symptomatic relief Follow up with PCP if symptoms persists Return here or go to ER if you have any new or worsening symptoms such as fever,  worsening abdominal pain, nausea/vomiting, flank pain     ED Prescriptions    Medication Sig Dispense Auth. Provider   sulfamethoxazole-trimethoprim (BACTRIM DS) 800-160 MG tablet Take 1 tablet by  mouth 2 (two) times daily for 7 days. 14 tablet Maryellen Dowdle, Zachery Dakins, FNP   phenazopyridine (PYRIDIUM) 100 MG tablet Take 1 tablet (100 mg total) by mouth 3 (three) times daily as needed for pain. 10 tablet Elise Knobloch, Zachery Dakins, FNP     PDMP not reviewed this encounter.   Durward Parcel, FNP 08/05/19 1047

## 2019-08-07 ENCOUNTER — Ambulatory Visit: Payer: BC Managed Care – PPO | Attending: Internal Medicine

## 2019-08-07 ENCOUNTER — Other Ambulatory Visit: Payer: Self-pay

## 2019-08-07 DIAGNOSIS — Z20822 Contact with and (suspected) exposure to covid-19: Secondary | ICD-10-CM

## 2019-08-07 LAB — URINE CULTURE: Culture: 70000 — AB

## 2019-08-08 LAB — NOVEL CORONAVIRUS, NAA: SARS-CoV-2, NAA: NOT DETECTED

## 2020-02-19 ENCOUNTER — Other Ambulatory Visit: Payer: Self-pay

## 2020-02-19 ENCOUNTER — Emergency Department (HOSPITAL_COMMUNITY)
Admission: EM | Admit: 2020-02-19 | Discharge: 2020-02-19 | Disposition: A | Discharge status: Home / Self Care | Payer: BC Managed Care – PPO | Attending: Emergency Medicine | Admitting: Emergency Medicine

## 2020-02-19 ENCOUNTER — Emergency Department (HOSPITAL_COMMUNITY): Payer: BC Managed Care – PPO

## 2020-02-19 ENCOUNTER — Encounter (HOSPITAL_COMMUNITY): Payer: Self-pay | Admitting: *Deleted

## 2020-02-19 DIAGNOSIS — I10 Essential (primary) hypertension: Secondary | ICD-10-CM | POA: Diagnosis not present

## 2020-02-19 DIAGNOSIS — R072 Precordial pain: Secondary | ICD-10-CM | POA: Diagnosis not present

## 2020-02-19 LAB — BASIC METABOLIC PANEL
Anion gap: 10 (ref 5–15)
BUN: 12 mg/dL (ref 6–20)
CO2: 24 mmol/L (ref 22–32)
Calcium: 9 mg/dL (ref 8.9–10.3)
Chloride: 105 mmol/L (ref 98–111)
Creatinine, Ser: 0.68 mg/dL (ref 0.61–1.24)
GFR calc Af Amer: >60 mL/min (ref >60)
GFR calc non Af Amer: >60 mL/min (ref >60)
Glucose, Bld: 108 mg/dL — ABNORMAL HIGH (ref 70–99)
Potassium: 4 mmol/L (ref 3.5–5.1)
Sodium: 139 mmol/L (ref 135–145)

## 2020-02-19 LAB — CBC
HCT: 45.2 % (ref 39.0–52.0)
Hemoglobin: 15.4 g/dL (ref 13.0–17.0)
MCH: 30.5 pg (ref 26.0–34.0)
MCHC: 34.1 g/dL (ref 30.0–36.0)
MCV: 89.5 fL (ref 80.0–100.0)
Platelets: 310 10*3/uL (ref 150–400)
RBC: 5.05 MIL/uL (ref 4.22–5.81)
RDW: 13.2 % (ref 11.5–15.5)
WBC: 8.7 10*3/uL (ref 4.0–10.5)
nRBC: 0 % (ref 0.0–0.2)

## 2020-02-19 LAB — D-DIMER, QUANTITATIVE: D-Dimer, Quant: <0.27 ug/mL-FEU (ref 0.00–0.50)

## 2020-02-19 LAB — TROPONIN I (HIGH SENSITIVITY)
Troponin I (High Sensitivity): 2 ng/L (ref <18)
Troponin I (High Sensitivity): 3 ng/L (ref <18)

## 2020-02-19 MED ORDER — DICLOFENAC SODIUM 1 % EX GEL: 2.0000 g | Freq: Four times a day (QID) | CUTANEOUS | 0 refills | Status: AC | PRN

## 2020-02-19 NOTE — ED Provider Notes (Signed)
Emergency Department Provider Note   I have reviewed the triage vital signs and the nursing notes.   HISTORY  Chief Complaint Chest Pain   HPI Nathan Pierce is a 39 y.o. male presents to the ED with left sided CP starting yesterday. Symptoms are intermittent and worse with movement. Patient is left chest and radiates to the left lateral chest. No injury. No fever, chills, or cough. No significant SOB. No other modifying factors.    Past Medical History:  Diagnosis Date  . ALS (amyotrophic lateral sclerosis) ([facility])   . Hyperlipidemia   . Hypertension     There are no problems to display for this patient.   Past Surgical History:  Procedure Laterality Date  . CHOLECYSTECTOMY    . LUMBAR PUNCTURE    . TONSILLECTOMY      Allergies Patient has no known allergies.  Family History  Problem Relation Age of Onset  . Healthy Mother   . Healthy Father     Social History Social History   Tobacco Use  . Smoking status: Never Smoker  . Smokeless tobacco: Never Used  Substance Use Topics  . Alcohol use: Yes    Comment: occ  . Drug use: Not on file    Review of Systems  Constitutional: No fever/chills Eyes: No visual changes. ENT: No sore throat. Cardiovascular: Positive chest pain. Respiratory: Denies shortness of breath. Gastrointestinal: No abdominal pain.  No nausea, no vomiting.  No diarrhea.  No constipation. Genitourinary: Negative for dysuria. Musculoskeletal: Negative for back pain. Skin: Negative for rash. Neurological: Negative for headaches, focal weakness or numbness.  10-point ROS otherwise negative.  ____________________________________________   PHYSICAL EXAM:  VITAL SIGNS: ED Triage Vitals  Enc Vitals Group     BP 02/19/20 1801 (!) 148/94     Pulse Rate 02/19/20 1801 (!) 104     Resp 02/19/20 1801 20     Temp 02/19/20 1801 98.9 F (37.2 C)     Temp Source 02/19/20 1801 Oral     SpO2 02/19/20 1801 98 %     Weight 02/19/20 1802 (!)  322 lb (146.1 kg)     Height 02/19/20 1802 5\' 9"  (1.753 m)   Constitutional: Alert and oriented. Well appearing and in no acute distress. Eyes: Conjunctivae are normal.  Head: Atraumatic. Nose: No congestion/rhinnorhea. Mouth/Throat: Mucous membranes are moist.  Neck: No stridor. Cardiovascular: Mild tachycardia. Good peripheral circulation. Grossly normal heart sounds.   Respiratory: Normal respiratory effort.  No retractions. Lungs CTAB. Gastrointestinal: Soft and nontender. No distention.  Musculoskeletal: No lower extremity tenderness nor edema. No gross deformities of extremities. Mild tenderness to palpation of the left lateral chest wall. No crepitus. No bruising.  Neurologic:  Normal speech and language. No gross focal neurologic deficits are appreciated.  Skin:  Skin is warm, dry and intact. No rash noted.   ____________________________________________   LABS (all labs ordered are listed, but only abnormal results are displayed)  Labs Reviewed  BASIC METABOLIC PANEL - Abnormal; Notable for the following components:      Result Value   Glucose, Bld 108 (*)    All other components within normal limits  CBC  D-DIMER, QUANTITATIVE (NOT AT [facility])  TROPONIN I (HIGH SENSITIVITY)  TROPONIN I (HIGH SENSITIVITY)   ____________________________________________  EKG   EKG Interpretation  Date/Time:  Tuesday February 19 2020 18:05:49 EDT Ventricular Rate:  104 PR Interval:  154 QRS Duration: 88 QT Interval:  330 QTC Calculation: 433 R Axis:   65  Text Interpretation: Sinus tachycardia Confirmed by Nicanor Alcon, April ([id number]) on 02/20/2020 8:25:19 AM       ____________________________________________  RADIOLOGY  DG Chest 2 View  Result Date: 02/19/2020 CLINICAL DATA:  Chest pain. EXAM: CHEST - 2 VIEW COMPARISON:  None. FINDINGS: There is no evidence of acute infiltrate, pleural effusion or pneumothorax. The heart size and mediastinal contours are within normal limits. The  visualized skeletal structures are unremarkable. Radiopaque surgical clips are seen within the right upper quadrant. IMPRESSION: No active cardiopulmonary disease. Electronically Signed   By: Aram Candela M.D.   On: 02/19/2020 19:11    ____________________________________________   PROCEDURES  Procedure(s) performed:   Procedures  None ____________________________________________   INITIAL IMPRESSION / ASSESSMENT AND PLAN / ED COURSE  Pertinent labs & imaging results that were available during my care of the patient were reviewed by me and considered in my medical decision making (see chart for details).   Patient presents to the ED with atypical CP. Suspect MSK etiology clinically. Patient does have mild tachycardia so d-dimer was sent and negative. HR improved. Troponin negative x 2. CXR negative. Doubt ACS. No PNA. Plan for MSK pain mgmt and close PCP follow up.    ____________________________________________  FINAL CLINICAL IMPRESSION(S) / ED DIAGNOSES  Final diagnoses:  Precordial chest pain     NEW OUTPATIENT MEDICATIONS STARTED DURING THIS VISIT:  Discharge Medication List as of 02/19/2020  9:38 PM    START taking these medications   Details  diclofenac Sodium (VOLTAREN) 1 % GEL Apply 2 g topically 4 (four) times daily as needed (pain)., Starting Tue 02/19/2020, Normal        Note:  This document was prepared using Dragon voice recognition software and may include unintentional dictation errors.  Alona Bene, MD, [facility]    Davona Kinoshita, Arlyss Repress, MD 02/21/20 1224

## 2020-02-19 NOTE — Discharge Instructions (Signed)
You were seen in the emergent pain in the chest.  Your lab work is reassuring and did not show evidence of heart attack or blood clots in the lung.  I am prescribing a topical medication to apply to the area of pain as needed.  Please follow closely with your primary care doctor and return to the emergency department any new or suddenly worsening symptoms.

## 2020-02-19 NOTE — ED Triage Notes (Signed)
Chest pain onset yesterday 

## 2020-10-07 IMAGING — DX DG CHEST 2V
2 series · 2 of 2 positions shown · non-contrast
Comparison: None.

CLINICAL DATA: Chest pain.

EXAM:
CHEST - 2 VIEW

[chest pa]
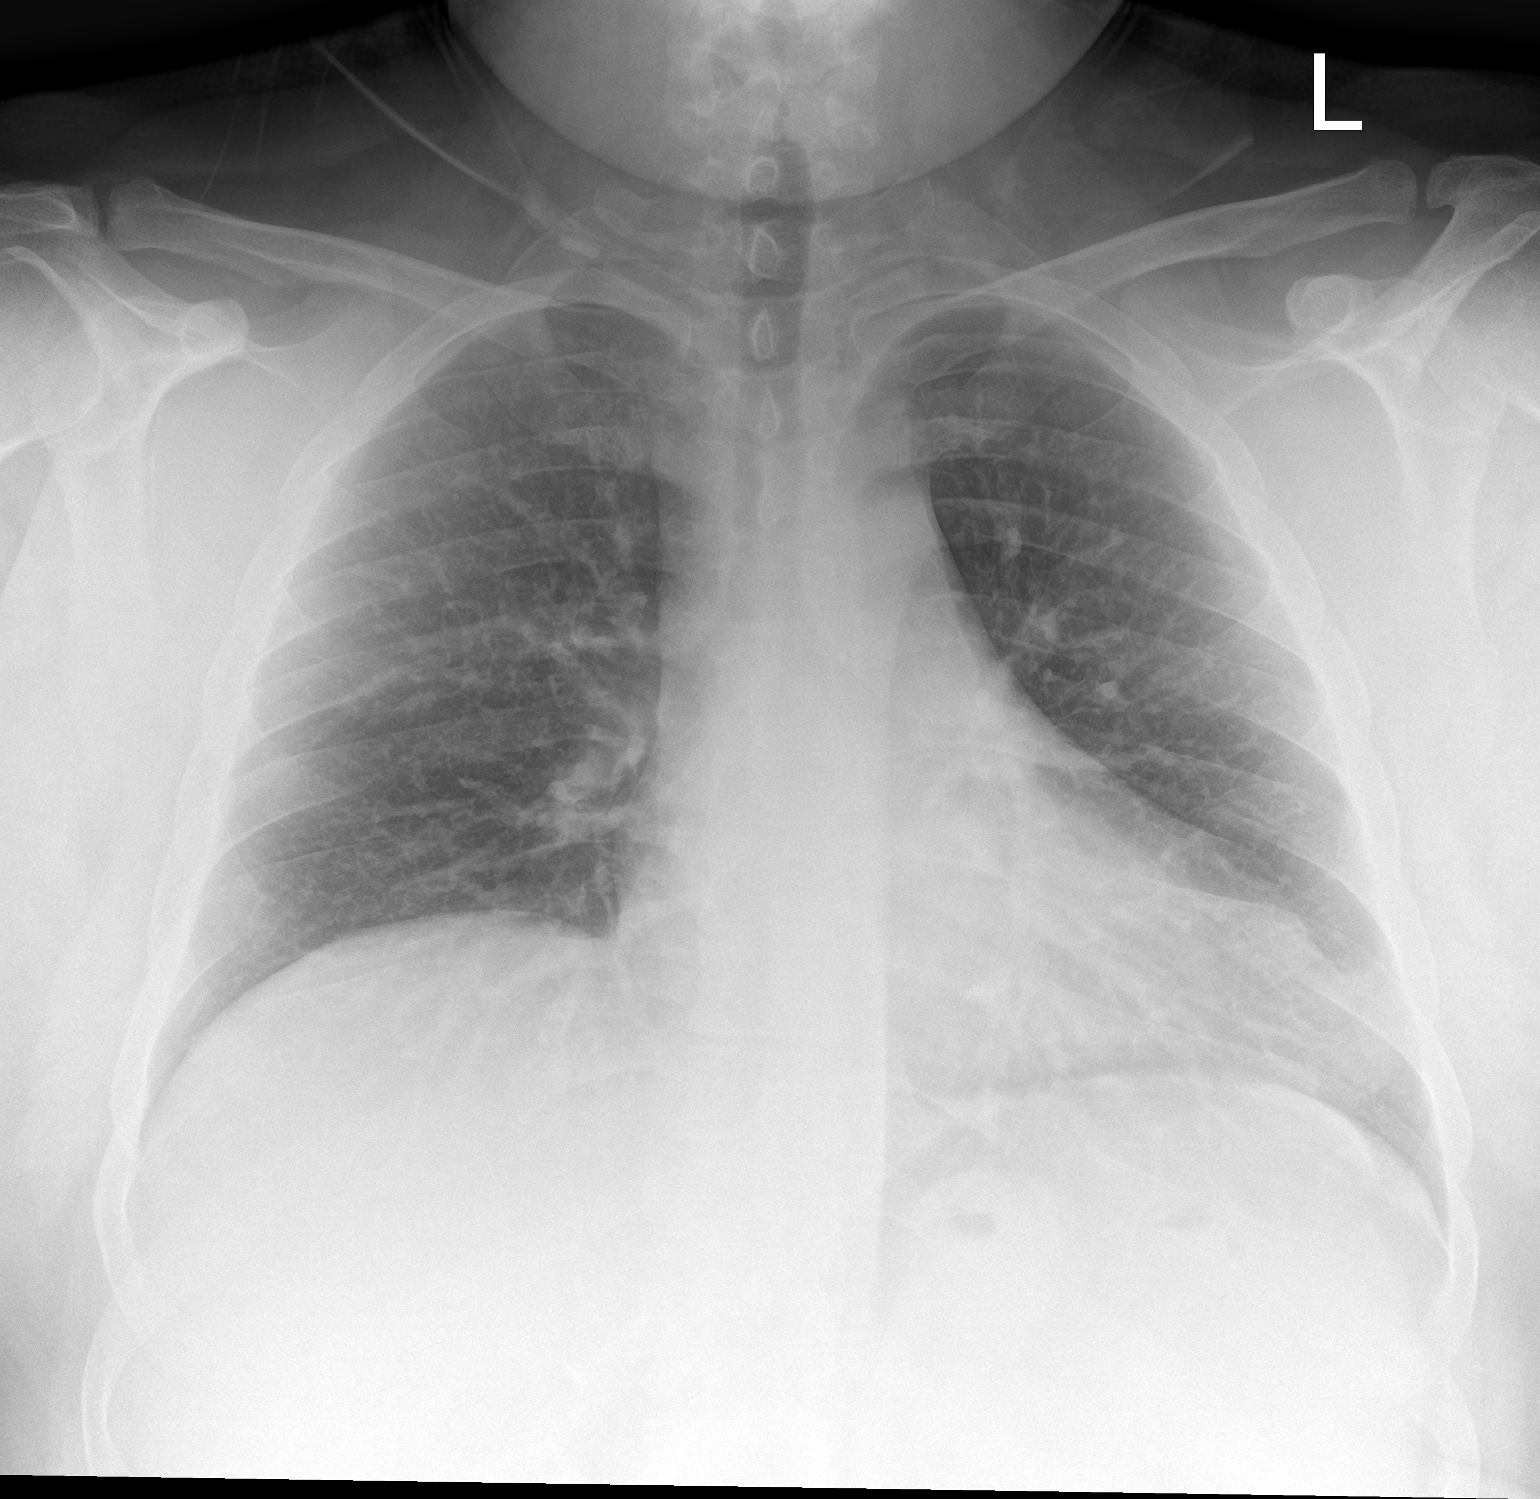

[chest lat]
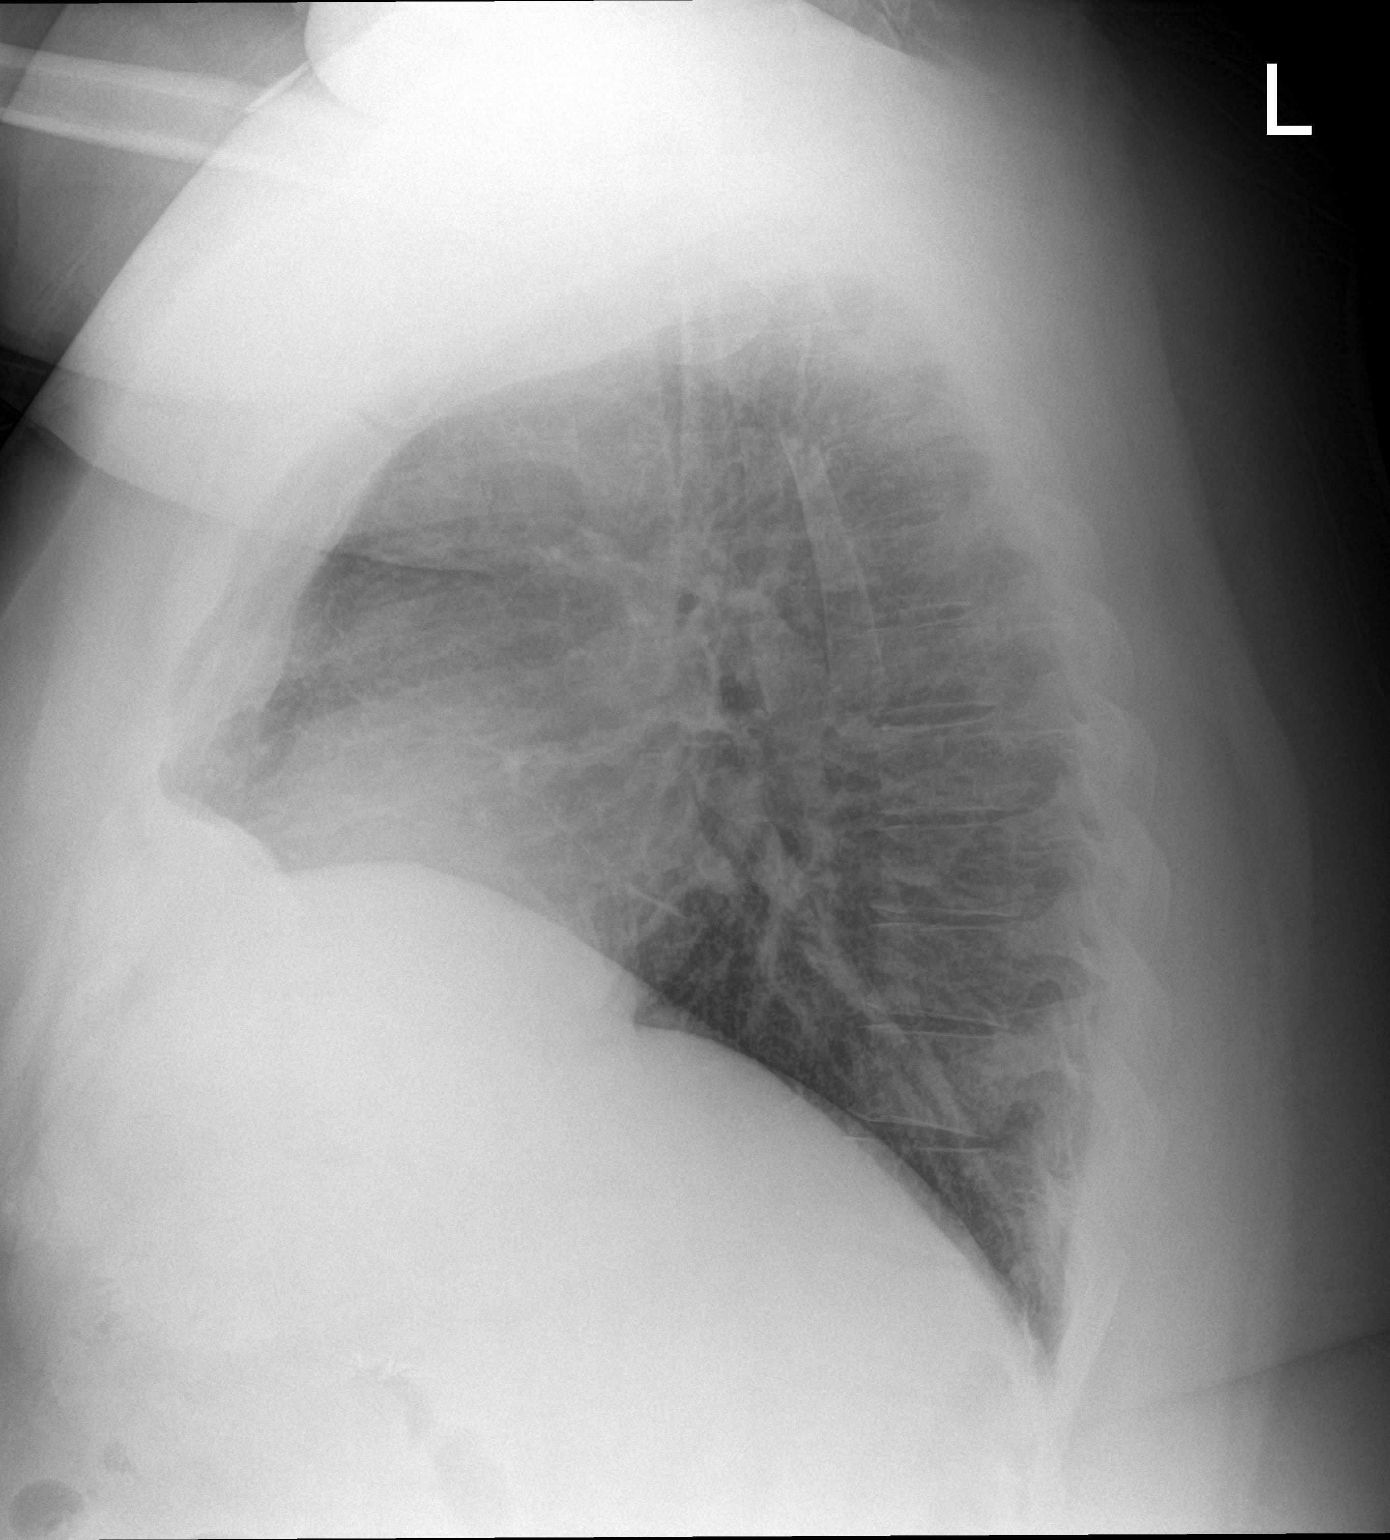

[2 of 2 positions shown; findings below may reference images not displayed]

FINDINGS: There is no evidence of acute infiltrate, pleural effusion or
pneumothorax. The heart size and mediastinal contours are within
normal limits. The visualized skeletal structures are unremarkable.
Radiopaque surgical clips are seen within the right upper quadrant.
IMPRESSION: No active cardiopulmonary disease.

## 2021-01-19 ENCOUNTER — Encounter (HOSPITAL_COMMUNITY): Payer: Self-pay | Admitting: *Deleted

## 2021-01-19 ENCOUNTER — Other Ambulatory Visit: Payer: Self-pay

## 2021-01-19 ENCOUNTER — Emergency Department (HOSPITAL_COMMUNITY)
Admission: EM | Admit: 2021-01-19 | Discharge: 2021-01-20 | Disposition: A | Discharge status: Home / Self Care | Payer: BC Managed Care – PPO | Attending: Emergency Medicine | Admitting: Emergency Medicine

## 2021-01-19 DIAGNOSIS — I10 Essential (primary) hypertension: Secondary | ICD-10-CM | POA: Diagnosis not present

## 2021-01-19 DIAGNOSIS — R0789 Other chest pain: Secondary | ICD-10-CM | POA: Insufficient documentation

## 2021-01-19 DIAGNOSIS — Z79899 Other long term (current) drug therapy: Secondary | ICD-10-CM | POA: Diagnosis not present

## 2021-01-19 NOTE — ED Triage Notes (Signed)
Pt with mid to left CP x 2 hours with nausea. Pt states doing nothing at time when pain started. Pt under trial studies with his dx of ALS.

## 2021-01-20 LAB — BASIC METABOLIC PANEL
Anion gap: 10 (ref 5–15)
BUN: 14 mg/dL (ref 6–20)
CO2: 25 mmol/L (ref 22–32)
Calcium: 8.8 mg/dL — ABNORMAL LOW (ref 8.9–10.3)
Chloride: 104 mmol/L (ref 98–111)
Creatinine, Ser: 0.71 mg/dL (ref 0.61–1.24)
GFR, Estimated: >60 mL/min (ref >60)
Glucose, Bld: 101 mg/dL — ABNORMAL HIGH (ref 70–99)
Potassium: 3.6 mmol/L (ref 3.5–5.1)
Sodium: 139 mmol/L (ref 135–145)

## 2021-01-20 LAB — CBC WITH DIFFERENTIAL/PLATELET
Abs Immature Granulocytes: 0.02 10*3/uL (ref 0.00–0.07)
Basophils Absolute: 0.1 10*3/uL (ref 0.0–0.1)
Basophils Relative: 1 %
Eosinophils Absolute: 0.1 10*3/uL (ref 0.0–0.5)
Eosinophils Relative: 1 %
HCT: 44.4 % (ref 39.0–52.0)
Hemoglobin: 15 g/dL (ref 13.0–17.0)
Immature Granulocytes: 0 %
Lymphocytes Relative: 21 %
Lymphs Abs: 1.8 10*3/uL (ref 0.7–4.0)
MCH: 30.5 pg (ref 26.0–34.0)
MCHC: 33.8 g/dL (ref 30.0–36.0)
MCV: 90.2 fL (ref 80.0–100.0)
Monocytes Absolute: 0.6 10*3/uL (ref 0.1–1.0)
Monocytes Relative: 7 %
Neutro Abs: 6.4 10*3/uL (ref 1.7–7.7)
Neutrophils Relative %: 70 %
Platelets: 325 10*3/uL (ref 150–400)
RBC: 4.92 MIL/uL (ref 4.22–5.81)
RDW: 13.4 % (ref 11.5–15.5)
WBC: 8.9 10*3/uL (ref 4.0–10.5)
nRBC: 0 % (ref 0.0–0.2)

## 2021-01-20 LAB — TROPONIN I (HIGH SENSITIVITY)
Troponin I (High Sensitivity): 3 ng/L (ref <18)
Troponin I (High Sensitivity): 4 ng/L (ref <18)

## 2021-01-20 LAB — D-DIMER, QUANTITATIVE: D-Dimer, Quant: 0.34 ug/mL-FEU (ref 0.00–0.50)

## 2021-01-20 MED ORDER — ASPIRIN 81 MG PO CHEW
324.0000 mg | CHEWABLE_TABLET | Freq: Once | ORAL | Status: AC
  Administered 2021-01-20: 324 mg via ORAL
  Filled 2021-01-20: qty 4

## 2021-01-20 NOTE — ED Provider Notes (Signed)
Nathan Pierce   CSN: [id number] Arrival date & time: 01/19/21  2112     History Chief Complaint  Patient presents with   Chest Pain    Nathan Pierce is a 40 y.o. male.  The history is provided by the patient.  Chest Pain He has history of hypertension, hyperlipidemia, ALS and comes in because of chest pains which started about 7 PM.  Pain is over the lower sternal area and is dull and he rates it at 3/10.  Pain will be present for a few seconds and then resolved.  He denies dyspnea, nausea, diaphoresis.  Nothing seems to make it better, nothing makes it worse.  He is a former smoker and denies history of diabetes.  There is no known family history of premature coronary atherosclerosis, but he is adopted.  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  He has not done anything to treat his symptoms.  He has had chest pains before, but never in this location.   Past Medical History:  Diagnosis Date   ALS (amyotrophic lateral sclerosis) (HCC)    Hyperlipidemia    Hypertension     There are no problems to display for this patient.   Past Surgical History:  Procedure Laterality Date   CHOLECYSTECTOMY     LUMBAR PUNCTURE     TONSILLECTOMY         Family History  Problem Relation Age of Onset   Healthy Mother    Healthy Father     Social History   Tobacco Use   Smoking status: Never   Smokeless tobacco: Never  Substance Use Topics   Alcohol use: Yes    Comment: occ    Home Medications Prior to Admission medications   Medication Sig Start Date End Date Taking? Authorizing Provider  diclofenac Sodium (VOLTAREN) 1 % GEL Apply 2 g topically 4 (four) times daily as needed (pain). 02/19/20   Long, Arlyss Repress, MD  metoprolol tartrate (LOPRESSOR) 25 MG tablet Take 25 mg by mouth 2 (two) times daily.    [provider]  phenazopyridine (PYRIDIUM) 100 MG tablet Take 1 tablet (100 mg total) by mouth  3 (three) times daily as needed for pain. 08/05/19   Avegno, Zachery Dakins, FNP  riluzole (RILUTEK) 50 MG tablet Take 50 mg by mouth every 12 (twelve) hours.    [provider]  rosuvastatin (CRESTOR) 10 MG tablet Take 10 mg by mouth daily.    [provider]    Allergies    Patient has no known allergies.  Review of Systems   Review of Systems  Cardiovascular:  Positive for chest pain.  All other systems reviewed and are negative.  Physical Exam Updated Vital Signs BP 127/78   Pulse (!) 109   Temp 98.2 F (36.8 C) (Oral)   Resp 20   Ht 5\' 9"  (1.753 m)   Wt (!) 145.2 kg   SpO2 96%   BMI 47.26 kg/m   Physical Exam Vitals and nursing Pierce reviewed.  40 year old male, resting comfortably and in no acute distress. Vital signs are significant for elevated heart rate. Oxygen saturation is 96%, which is normal. Head is normocephalic and atraumatic. PERRLA, EOMI. Oropharynx is clear. Neck is nontender and supple without adenopathy or JVD. Back is nontender and there is no CVA tenderness. Lungs are clear without rales, wheezes, or rhonchi. Chest is nontender. Heart has regular rate and  rhythm without murmur. Abdomen is soft, flat, nontender without masses or hepatosplenomegaly and peristalsis is normoactive. Extremities have trace edema, full range of motion is present. Skin is warm and dry without rash. Neurologic: Mental status is normal, cranial nerves are intact, moves all extremities equally.  ED Results / Procedures / Treatments   Labs (all labs ordered are listed, but only abnormal results are displayed) Labs Reviewed  BASIC METABOLIC PANEL - Abnormal; Notable for the following components:      Result Value   Glucose, Bld 101 (*)    Calcium 8.8 (*)    All other components within normal limits  D-DIMER, QUANTITATIVE  CBC WITH DIFFERENTIAL/PLATELET  TROPONIN I (HIGH SENSITIVITY)  TROPONIN I (HIGH SENSITIVITY)    EKG EKG  Interpretation  Date/Time:  Monday January 19 2021 21:20:47 EDT Ventricular Rate:  115 PR Interval:  148 QRS Duration: 90 QT Interval:  322 QTC Calculation: 445 R Axis:   86 Text Interpretation: Sinus tachycardia Otherwise normal ECG since last tracing no significant change Confirmed by Mancel Bale [phone]) on 01/19/2021 9:34:54 PM  Radiology No results found.  Procedures Procedures   Medications Ordered in ED Medications  aspirin chewable tablet 324 mg (324 mg Oral Given 01/20/21 0144)    ED Course  I have reviewed the triage vital signs and the nursing notes.  Pertinent labs & imaging results that were available during my care of the patient were reviewed by me and considered in my medical decision making (see chart for details).   MDM Rules/Calculators/A&P                         Chest pain which seems somewhat atypical.  However, he does have significant cardiac risk factors and does have tachycardia.  ECG shows no acute changes.  Will check screening labs including troponin x2.  Given decreased mobility with history of ALS and tachycardia, will check GI D-dimer to rule out pulmonary embolism.  Patient risk score per heart pathway is 2, which puts him at low risk for major adverse cardiac events in the next 6 weeks.  Old records are reviewed, and he does have a prior ED evaluation for chest pain.  Initial labs are normal including normal troponin and normal D-dimer.  Patient does state that he has not taken his evening dose of metoprolol, which may account for his tachycardia.  Repeat troponin is pending.  Repeat troponin is normal.  Patient is discharged with instructions to follow-up with PCP.  Final Clinical Impression(s) / ED Diagnoses Final diagnoses:  Atypical chest pain    Rx / DC Orders ED Discharge Orders     None        Dione Booze, MD 01/20/21 0451

## 2023-04-25 ENCOUNTER — Ambulatory Visit
Admission: RE | Admit: 2023-04-25 | Discharge: 2023-04-25 | Disposition: A | Discharge status: Home / Self Care | Payer: BC Managed Care – PPO | Source: Ambulatory Visit

## 2023-04-25 VITALS — BP 150/101 | HR 101 | Temp 98.2°F | Resp 17

## 2023-04-25 DIAGNOSIS — J069 Acute upper respiratory infection, unspecified: Secondary | ICD-10-CM | POA: Diagnosis not present

## 2023-04-25 MED ORDER — CETIRIZINE HCL 10 MG PO TABS: 10.0000 mg | ORAL_TABLET | Freq: Every day | ORAL | 0 refills | Status: AC

## 2023-04-25 MED ORDER — AMOXICILLIN-POT CLAVULANATE 875-125 MG PO TABS: 1.0000 | ORAL_TABLET | Freq: Two times a day (BID) | ORAL | 0 refills | Status: AC

## 2023-04-25 MED ORDER — FLUTICASONE PROPIONATE 50 MCG/ACT NA SUSP: 2.0000 | Freq: Every day | NASAL | 0 refills | Status: AC

## 2023-04-25 NOTE — Discharge Instructions (Addendum)
Take medication as prescribed.  Recommend over-the-counter Coricidin HBP to help with your cough. Increase fluids and allow for plenty of rest. May take over-the-counter Tylenol as needed for pain, fever, general discomfort. Warm salt water gargles 3-4 times daily as needed for throat pain or discomfort. Normal saline nasal spray throughout the day to help with nasal congestion and runny nose. For the cough, recommend using a humidifier in your bedroom at nighttime during sleep and sleeping elevated on pillows while cough symptoms persist. As discussed, if symptoms do not improve over the next 72 hours, a prescription for Augmentin has been sent to your preferred pharmacy for pickup. If symptoms fail to improve with this treatment, please follow-up with your primary care physician for further evaluation. Follow-up as needed.

## 2023-04-25 NOTE — ED Provider Notes (Signed)
[facility]    CSN: [id number] Arrival date & time: 04/25/23  1837      History   Chief Complaint Chief Complaint  Patient presents with   Cough    Sore throat, stuffy nose. - Entered by patient    HPI Nathan Pierce is a 42 y.o. male.   The history is provided by the patient.   Patient presents for complaints of nasal congestion, sore throat, sinus pressure, and cough that is been present for the past 5 days.  Patient denies fever, chills, ear pain, ear drainage, wheezing, difficulty breathing, chest pain, abdominal pain, nausea, vomiting, or diarrhea.  Patient reports that he has not been taking any medication for his symptoms.  Reports that he took 3 home COVID test, all were negative. Past Medical History:  Diagnosis Date   ALS (amyotrophic lateral sclerosis) (HCC)    Hyperlipidemia    Hypertension     There are no problems to display for this patient.   Past Surgical History:  Procedure Laterality Date   CHOLECYSTECTOMY     LUMBAR PUNCTURE     TONSILLECTOMY         Home Medications    Prior to Admission medications   Medication Sig Start Date End Date Taking? Authorizing Provider  amoxicillin-clavulanate (AUGMENTIN) 875-125 MG tablet Take 1 tablet by mouth every 12 (twelve) hours. 04/28/23  Yes Leath-Warren, Sadie Haber, NP  cetirizine (ZYRTEC) 10 MG tablet Take 1 tablet (10 mg total) by mouth daily. 04/25/23  Yes Leath-Warren, Sadie Haber, NP  fluticasone (FLONASE) 50 MCG/ACT nasal spray Place 2 sprays into both nostrils daily. 04/25/23  Yes Leath-Warren, Sadie Haber, NP  diclofenac Sodium (VOLTAREN) 1 % GEL Apply 2 g topically 4 (four) times daily as needed (pain). 02/19/20   Long, Arlyss Repress, MD  metoprolol tartrate (LOPRESSOR) 25 MG tablet Take 25 mg by mouth 2 (two) times daily.    [provider]  phenazopyridine (PYRIDIUM) 100 MG tablet Take 1 tablet (100 mg total) by mouth 3 (three) times daily as needed for pain. 08/05/19   Avegno,  Zachery Dakins, FNP  riluzole (RILUTEK) 50 MG tablet Take 50 mg by mouth every 12 (twelve) hours.    [provider]  rosuvastatin (CRESTOR) 10 MG tablet Take 10 mg by mouth daily.    [provider]  Tofersen (QALSODY) 100 MG/15ML SOLN     [provider]    Family History Family History  Problem Relation Age of Onset   Healthy Mother    Healthy Father     Social History Social History   Tobacco Use   Smoking status: Never   Smokeless tobacco: Never  Substance Use Topics   Alcohol use: Yes    Comment: occ     Allergies   Patient has no known allergies.   Review of Systems Review of Systems Per HPI  Physical Exam Triage Vital Signs ED Triage Vitals  Encounter Vitals Group     BP 04/25/23 1914 (!) 150/101     Systolic BP Percentile --      Diastolic BP Percentile --      Pulse Rate 04/25/23 1914 (!) 101     Resp 04/25/23 1914 17     Temp 04/25/23 1914 98.2 F (36.8 C)     Temp Source 04/25/23 1914 Oral     SpO2 04/25/23 1914 94 %     Weight --      Height --      Head  Circumference --      Peak Flow --      Pain Score 04/25/23 1915 0     Pain Loc --      Pain Education --      Exclude from Growth Chart --    No data found.  Updated Vital Signs BP (!) 150/101 (BP Location: Right Arm)   Pulse (!) 101   Temp 98.2 F (36.8 C) (Oral)   Resp 17   SpO2 94%   Visual Acuity Right Eye Distance:   Left Eye Distance:   Bilateral Distance:    Right Eye Near:   Left Eye Near:    Bilateral Near:     Physical Exam Vitals and nursing note reviewed.  Constitutional:      General: He is not in acute distress.    Appearance: Normal appearance.  HENT:     Head: Normocephalic.     Right Ear: Tympanic membrane, ear canal and external ear normal.     Left Ear: Tympanic membrane, ear canal and external ear normal.     Nose: Congestion present.     Mouth/Throat:     Mouth: Mucous membranes are moist.     Comments: Cobblestoning present  to posterior oropharynx  Eyes:     Extraocular Movements: Extraocular movements intact.     Conjunctiva/sclera: Conjunctivae normal.     Pupils: Pupils are equal, round, and reactive to light.  Cardiovascular:     Rate and Rhythm: Normal rate and regular rhythm.     Pulses: Normal pulses.     Heart sounds: Normal heart sounds.  Pulmonary:     Effort: Pulmonary effort is normal. No respiratory distress.     Breath sounds: Normal breath sounds. No stridor. No wheezing, rhonchi or rales.  Abdominal:     General: Bowel sounds are normal.     Palpations: Abdomen is soft.     Tenderness: There is no abdominal tenderness.  Skin:    General: Skin is warm and dry.  Neurological:     General: No focal deficit present.     Mental Status: He is alert and oriented to person, place, and time.  Psychiatric:        Mood and Affect: Mood normal.        Behavior: Behavior normal.      UC Treatments / Results  Labs (all labs ordered are listed, but only abnormal results are displayed) Labs Reviewed - No data to display  EKG   Radiology No results found.  Procedures Procedures (including critical care time)  Medications Ordered in UC Medications - No data to display  Initial Impression / Assessment and Plan / UC Course  I have reviewed the triage vital signs and the nursing notes.  Pertinent labs & imaging results that were available during my care of the patient were reviewed by me and considered in my medical decision making (see chart for details).  Suspect a viral upper respiratory infection at this time.  Will provide symptomatic treatment with fluticasone 50 micro nasal spray, and cetirizine 10 mg daily.  Patient was advised if symptoms or not improving over the next 3 days, a prescription for Augmentin 875/125 mg tablets has been sent to his preferred pharmacy to begin on 10/24.  Supportive care recommendations were provided and discussed with the patient to include increasing  fluids, allowing for plenty of rest, over-the-counter analgesics such as Tylenol, and normal saline nasal spray.  Patient was given strict follow-up precautions.  Patient is in agreement with this plan of care and verbalizes understanding.  All questions were answered.  Patient stable for discharge.   Final Clinical Impressions(s) / UC Diagnoses   Final diagnoses:  Viral upper respiratory tract infection with cough     Discharge Instructions      Take medication as prescribed.  Recommend over-the-counter Coricidin HBP to help with your cough. Increase fluids and allow for plenty of rest. May take over-the-counter Tylenol as needed for pain, fever, general discomfort. Warm salt water gargles 3-4 times daily as needed for throat pain or discomfort. Normal saline nasal spray throughout the day to help with nasal congestion and runny nose. For the cough, recommend using a humidifier in your bedroom at nighttime during sleep and sleeping elevated on pillows while cough symptoms persist. As discussed, if symptoms do not improve over the next 72 hours, a prescription for Augmentin has been sent to your preferred pharmacy for pickup. If symptoms fail to improve with this treatment, please follow-up with your primary care physician for further evaluation. Follow-up as needed.     ED Prescriptions     Medication Sig Dispense Auth. Provider   fluticasone (FLONASE) 50 MCG/ACT nasal spray Place 2 sprays into both nostrils daily. 16 g Leath-Warren, Sadie Haber, NP   cetirizine (ZYRTEC) 10 MG tablet Take 1 tablet (10 mg total) by mouth daily. 30 tablet Leath-Warren, Sadie Haber, NP   amoxicillin-clavulanate (AUGMENTIN) 875-125 MG tablet Take 1 tablet by mouth every 12 (twelve) hours. 14 tablet Leath-Warren, Sadie Haber, NP      PDMP not reviewed this encounter.   Abran Cantor, NP 04/25/23 1935

## 2023-04-25 NOTE — ED Triage Notes (Signed)
Pt c/o cough, nasal congestion and sore throat, x 5 days. Too 3 home COVID test all were negative.

## 2023-08-08 ENCOUNTER — Encounter (HOSPITAL_COMMUNITY): Payer: Self-pay

## 2023-08-08 ENCOUNTER — Emergency Department (HOSPITAL_COMMUNITY): Payer: BC Managed Care – PPO

## 2023-08-08 ENCOUNTER — Emergency Department (HOSPITAL_COMMUNITY)
Admission: EM | Admit: 2023-08-08 | Discharge: 2023-08-08 | Disposition: A | Discharge status: Home / Self Care | Payer: BC Managed Care – PPO | Attending: Emergency Medicine | Admitting: Emergency Medicine

## 2023-08-08 ENCOUNTER — Other Ambulatory Visit: Payer: Self-pay

## 2023-08-08 DIAGNOSIS — K760 Fatty (change of) liver, not elsewhere classified: Secondary | ICD-10-CM | POA: Diagnosis not present

## 2023-08-08 DIAGNOSIS — R1031 Right lower quadrant pain: Secondary | ICD-10-CM

## 2023-08-08 LAB — COMPREHENSIVE METABOLIC PANEL
ALT: 39 U/L (ref 0–44)
AST: 30 U/L (ref 15–41)
Albumin: 4.1 g/dL (ref 3.5–5.0)
Alkaline Phosphatase: 74 U/L (ref 38–126)
Anion gap: 12 (ref 5–15)
BUN: 10 mg/dL (ref 6–20)
CO2: 22 mmol/L (ref 22–32)
Calcium: 9.3 mg/dL (ref 8.9–10.3)
Chloride: 107 mmol/L (ref 98–111)
Creatinine, Ser: 0.89 mg/dL (ref 0.61–1.24)
GFR, Estimated: >60 mL/min (ref >60)
Glucose, Bld: 93 mg/dL (ref 70–99)
Potassium: 4.6 mmol/L (ref 3.5–5.1)
Sodium: 141 mmol/L (ref 135–145)
Total Bilirubin: 0.6 mg/dL (ref 0.0–1.2)
Total Protein: 7.5 g/dL (ref 6.5–8.1)

## 2023-08-08 LAB — URINALYSIS, ROUTINE W REFLEX MICROSCOPIC
Bilirubin Urine: NEGATIVE
Glucose, UA: NEGATIVE mg/dL
Hgb urine dipstick: NEGATIVE
Ketones, ur: NEGATIVE mg/dL
Leukocytes,Ua: NEGATIVE
Nitrite: NEGATIVE
Protein, ur: NEGATIVE mg/dL
Specific Gravity, Urine: 1.025 (ref 1.005–1.030)
pH: 5 (ref 5.0–8.0)

## 2023-08-08 LAB — LIPASE, BLOOD: Lipase: 23 U/L (ref 11–51)

## 2023-08-08 LAB — CBC
HCT: 48.3 % (ref 39.0–52.0)
Hemoglobin: 16.2 g/dL (ref 13.0–17.0)
MCH: 29.9 pg (ref 26.0–34.0)
MCHC: 33.5 g/dL (ref 30.0–36.0)
MCV: 89.1 fL (ref 80.0–100.0)
Platelets: 294 10*3/uL (ref 150–400)
RBC: 5.42 MIL/uL (ref 4.22–5.81)
RDW: 13.3 % (ref 11.5–15.5)
WBC: 11.1 10*3/uL — ABNORMAL HIGH (ref 4.0–10.5)
nRBC: 0 % (ref 0.0–0.2)

## 2023-08-08 MED ORDER — IOHEXOL 350 MG/ML SOLN
100.0000 mL | Freq: Once | INTRAVENOUS | Status: AC | PRN
  Administered 2023-08-08: 100 mL via INTRAVENOUS

## 2023-08-08 NOTE — ED Notes (Signed)
 Patient transported to CT

## 2023-08-08 NOTE — ED Provider Notes (Signed)
[place] [facility] AT [facility] Provider Note   CSN: [id number] Arrival date & time: 08/08/23  1211     History  Chief Complaint  Patient presents with   Abdominal Pain    Nathan Pierce is a 43 y.o. male.  Patient presents with worsening right lower quadrant abdominal pain sharp and aching.  No history of similar.  Patient still has appendix no history of kidney stones.  No history of inguinal hernia history.  The history is provided by the patient.  Abdominal Pain Associated symptoms: nausea   Associated symptoms: no chest pain, no chills, no dysuria, no fever, no shortness of breath and no vomiting        Home Medications Prior to Admission medications   Medication Sig Start Date End Date Taking? Authorizing Provider  amoxicillin-clavulanate (AUGMENTIN) 875-125 MG tablet Take 1 tablet by mouth every 12 (twelve) hours. 04/28/23   Leath-Warren, Sadie Haber, NP  cetirizine (ZYRTEC) 10 MG tablet Take 1 tablet (10 mg total) by mouth daily. 04/25/23   Leath-Warren, Sadie Haber, NP  diclofenac Sodium (VOLTAREN) 1 % GEL Apply 2 g topically 4 (four) times daily as needed (pain). 02/19/20   Long, Arlyss Repress, MD  fluticasone (FLONASE) 50 MCG/ACT nasal spray Place 2 sprays into both nostrils daily. 04/25/23   Leath-Warren, Sadie Haber, NP  metoprolol tartrate (LOPRESSOR) 25 MG tablet Take 25 mg by mouth 2 (two) times daily.    [provider]  phenazopyridine (PYRIDIUM) 100 MG tablet Take 1 tablet (100 mg total) by mouth 3 (three) times daily as needed for pain. 08/05/19   Avegno, Zachery Dakins, FNP  riluzole (RILUTEK) 50 MG tablet Take 50 mg by mouth every 12 (twelve) hours.    [provider]  rosuvastatin (CRESTOR) 10 MG tablet Take 10 mg by mouth daily.    [provider]  Tofersen (QALSODY) 100 MG/15ML SOLN     [provider]      Allergies    Patient has no known allergies.    Review of Systems   Review of Systems   Constitutional:  Negative for chills and fever.  HENT:  Negative for congestion.   Eyes:  Negative for visual disturbance.  Respiratory:  Negative for shortness of breath.   Cardiovascular:  Negative for chest pain.  Gastrointestinal:  Positive for abdominal pain and nausea. Negative for vomiting.  Genitourinary:  Negative for dysuria, flank pain and testicular pain.  Musculoskeletal:  Negative for back pain, neck pain and neck stiffness.  Skin:  Negative for rash.  Neurological:  Negative for light-headedness and headaches.    Physical Exam Updated Vital Signs BP 138/79 (BP Location: Left Arm)   Pulse 97   Temp 98.3 F (36.8 C) (Oral)   Resp 16   Ht 5\' 9"  (1.753 m)   Wt (!) 145.2 kg   SpO2 100%   BMI 47.27 kg/m  Physical Exam Vitals and nursing note reviewed.  Constitutional:      General: He is not in acute distress.    Appearance: He is well-developed.  HENT:     Head: Normocephalic and atraumatic.     Mouth/Throat:     Mouth: Mucous membranes are moist.  Eyes:     General:        Right eye: No discharge.        Left eye: No discharge.     Conjunctiva/sclera: Conjunctivae normal.  Neck:     Trachea: No tracheal deviation.  Cardiovascular:  Rate and Rhythm: Normal rate and regular rhythm.     Heart sounds: No murmur heard. Pulmonary:     Effort: Pulmonary effort is normal.     Breath sounds: Normal breath sounds.  Abdominal:     General: There is no distension.     Palpations: Abdomen is soft.     Tenderness: There is abdominal tenderness in the right lower quadrant. There is no guarding.  Musculoskeletal:     Cervical back: Normal range of motion and neck supple. No rigidity.  Skin:    General: Skin is warm.     Capillary Refill: Capillary refill takes less than 2 seconds.     Findings: No rash.  Neurological:     General: No focal deficit present.     Mental Status: He is alert.     Cranial Nerves: No cranial nerve deficit.  Psychiatric:         Mood and Affect: Mood normal.     ED Results / Procedures / Treatments   Labs (all labs ordered are listed, but only abnormal results are displayed) Labs Reviewed  CBC - Abnormal; Notable for the following components:      Result Value   WBC 11.1 (*)    All other components within normal limits  URINALYSIS, ROUTINE W REFLEX MICROSCOPIC - Abnormal; Notable for the following components:   APPearance HAZY (*)    All other components within normal limits  LIPASE, BLOOD  COMPREHENSIVE METABOLIC PANEL    EKG None  Radiology CT ABDOMEN PELVIS W CONTRAST Result Date: 08/08/2023 CLINICAL DATA:  Right lower quadrant abdominal pain EXAM: CT ABDOMEN AND PELVIS WITH CONTRAST TECHNIQUE: Multidetector CT imaging of the abdomen and pelvis was performed using the standard protocol following bolus administration of intravenous contrast. RADIATION DOSE REDUCTION: This exam was performed according to the departmental dose-optimization program which includes automated exposure control, adjustment of the mA and/or kV according to patient size and/or use of iterative reconstruction technique. CONTRAST:  OMNIPAQUE IOHEXOL 350 MG/ML SOLN COMPARISON:  100 cc Omnipaque 350 FINDINGS: Lower chest: Lung bases are clear. Hepatobiliary: Mild fatty change of the liver. Previous cholecystectomy. No focal finding. Pancreas: Normal Spleen: Normal Adrenals/Urinary Tract: Adrenal glands are normal. Left kidney is normal. Right kidney is normal except for an upper pole simple appearing cyst with maximal dimension of 4.5 cm. No follow-up recommended. No stone or hydronephrosis. Normal appearance of the bladder. Stomach/Bowel: Stomach and small intestine are normal. Normal appendix. No abnormal colon finding. Vascular/Lymphatic: Aorta and IVC are normal.  No adenopathy. Reproductive: Normal Other: No free fluid or air. Musculoskeletal: No significant degenerative changes. IMPRESSION: 1. No acute finding to explain the clinical  presentation. Normal appendix. 2. Mild fatty change of the liver. Previous cholecystectomy. Electronically Signed   By: Paulina Fusi M.D.   On: 08/08/2023 22:04    Procedures Procedures    Medications Ordered in ED Medications  iohexol (OMNIPAQUE) 350 MG/ML injection 100 mL (100 mLs Intravenous Contrast Given 08/08/23 2130)    ED Course/ Medical Decision Making/ A&P                                 Medical Decision Making Amount and/or Complexity of Data Reviewed Radiology: ordered.  Risk Prescription drug management.   Patient presents with mild to moderate right lower quadrant pain differential includes appendicitis, lymphadenitis, constipation, bowel gas, hernia, other.  Blood work ordered independent reassuring with  normal white count, normal hemoglobin, electrolytes and kidney function unremarkable.  Lipase normal no signs of pancreatitis. Delay with CT scan due to the ER being extremely busy.  CT abdomen pelvis results independently reviewed reassuring normal appendix, no acute finding, mild fatty liver.  Patient stable for outpatient follow-up.        Final Clinical Impression(s) / ED Diagnoses Final diagnoses:  Abdominal pain, right lower quadrant  Fatty liver    Rx / DC Orders ED Discharge Orders     None         Blane Ohara, MD 08/08/23 2319

## 2023-08-08 NOTE — ED Provider Triage Note (Signed)
Emergency Medicine Provider Triage Evaluation Note  Nathan Pierce , a 43 y.o. male  was evaluated in triage.  Pt complains of RLQ abdominal pain starting around 1100 this AM. Was drinking coffee when it started. Felt nauseous, but has not vomitted. Hx of gallbladder surgery, still has his appendix. Pain does not radiate anywhere.   Review of Systems  Positive: Abd pain, nausea Negative: Groin pain, urinary sx, V/D  Physical Exam  Ht 5\' 9"  (1.753 m)   Wt (!) 145.2 kg   BMI 47.27 kg/m  Gen:   Awake, no distress   Resp:  Normal effort  MSK:   Moves extremities without difficulty  Other:    Medical Decision Making  Medically screening exam initiated at 1:07 PM.  Appropriate orders placed.  Ibraheem Voris was informed that the remainder of the evaluation will be completed by another provider, this initial triage assessment does not replace that evaluation, and the importance of remaining in the ED until their evaluation is complete.  Workup initiated   Javon Hupfer T, PA-C 08/08/23 1308

## 2023-08-08 NOTE — ED Notes (Signed)
Pt began experiencing nausea lower right abd pain around 1100. Pain was initially a 4/10 and progressively worsened to "8 or 9/10" around 11:45. Pt describes the pain as sharp and aching. Coughing, walking and palpating worsens the pain.

## 2023-08-08 NOTE — ED Triage Notes (Signed)
Pt c/o RLQ abdominal pain, and nausea started at 1110 today.

## 2023-08-08 NOTE — Discharge Instructions (Signed)
Thank you for your patience this evening.  Good news your CT scan did not show any significant abnormality, your appendix was normal. Use Tylenol every 4 hours or ibuprofen every 6 hours needed for pain.  Follow-up with your local doctor for reassessment if symptoms are not improved.

## 2023-11-14 ENCOUNTER — Ambulatory Visit: Admission: EM | Admit: 2023-11-14 | Discharge: 2023-11-14 | Disposition: A | Discharge status: Home / Self Care

## 2023-11-14 DIAGNOSIS — L03116 Cellulitis of left lower limb: Secondary | ICD-10-CM

## 2023-11-14 MED ORDER — CEPHALEXIN 500 MG PO CAPS: 500.0000 mg | ORAL_CAPSULE | Freq: Two times a day (BID) | ORAL | 0 refills | Status: AC

## 2023-11-14 MED ORDER — CEPHALEXIN 500 MG PO CAPS: 500.0000 mg | ORAL_CAPSULE | Freq: Two times a day (BID) | ORAL | 0 refills | Status: DC

## 2023-11-14 MED ORDER — TRIAMCINOLONE ACETONIDE 0.1 % EX CREA: 1.0000 | TOPICAL_CREAM | Freq: Two times a day (BID) | CUTANEOUS | 0 refills | Status: DC

## 2023-11-14 MED ORDER — TRIAMCINOLONE ACETONIDE 0.1 % EX CREA: 1.0000 | TOPICAL_CREAM | Freq: Two times a day (BID) | CUTANEOUS | 0 refills | Status: AC

## 2023-11-14 NOTE — ED Triage Notes (Signed)
 Pt presents to UC for c/o bug bite on left lower leg x3 days. Pt states it has become swollen, red, and itchy.

## 2023-11-15 NOTE — ED Provider Notes (Signed)
 [facility]    CSN: [id number] Arrival date & time: 11/14/23  1901      History   Chief Complaint No chief complaint on file.   HPI Nathan Pierce is a 43 y.o. male.   Patient presenting today with a 3-day history of progressively worsening redness, warmth, swelling, tenderness at the site of a bug bite on the left lower leg posteriorly.  Denies drainage, bleeding, fevers, chills, body aches.  So far not trying anything over-the-counter for symptoms.    Past Medical History:  Diagnosis Date   ALS (amyotrophic lateral sclerosis) (HCC)    Hyperlipidemia    Hypertension     There are no active problems to display for this patient.   Past Surgical History:  Procedure Laterality Date   CHOLECYSTECTOMY     LUMBAR PUNCTURE     TONSILLECTOMY         Home Medications    Prior to Admission medications   Medication Sig Start Date End Date Taking? Authorizing Provider  metoprolol tartrate (LOPRESSOR) 25 MG tablet Take 25 mg by mouth 2 (two) times daily.   Yes [provider]  omeprazole (PRILOSEC) 20 MG capsule Take 40 mg by mouth. 02/06/18  Yes [provider]  riluzole (RILUTEK) 50 MG tablet Take 50 mg by mouth every 12 (twelve) hours.   Yes [provider]  rosuvastatin (CRESTOR) 10 MG tablet Take 10 mg by mouth daily.   Yes [provider]  Tofersen (QALSODY) 100 MG/15ML SOLN    Yes [provider]  ZEPBOUND 5 MG/0.5ML Pen Inject 5 mg into the skin. 11/07/23  Yes [provider]  amoxicillin -clavulanate (AUGMENTIN ) 875-125 MG tablet Take 1 tablet by mouth every 12 (twelve) hours. 04/28/23   Leath-Warren, Belen Bowers, NP  cephALEXin (KEFLEX) 500 MG capsule Take 1 capsule (500 mg total) by mouth 2 (two) times daily. 11/14/23   Corbin Dess, PA-C  cetirizine  (ZYRTEC ) 10 MG tablet Take 1 tablet (10 mg total) by mouth daily. 04/25/23   Leath-Warren, Belen Bowers, NP  diclofenac  Sodium (VOLTAREN ) 1 % GEL Apply  2 g topically 4 (four) times daily as needed (pain). 02/19/20   Long, Joshua G, MD  fluticasone  (FLONASE ) 50 MCG/ACT nasal spray Place 2 sprays into both nostrils daily. 04/25/23   Leath-Warren, Belen Bowers, NP  phenazopyridine  (PYRIDIUM ) 100 MG tablet Take 1 tablet (100 mg total) by mouth 3 (three) times daily as needed for pain. 08/05/19   Avegno, Komlanvi S, FNP  triamcinolone cream (KENALOG) 0.1 % Apply 1 Application topically 2 (two) times daily. 11/14/23   Corbin Dess, PA-C    Family History Family History  Problem Relation Age of Onset   Healthy Mother    Healthy Father     Social History Social History   Tobacco Use   Smoking status: Some Days    Types: Cigars   Smokeless tobacco: Never  Substance Use Topics   Alcohol use: Yes    Comment: occ   Drug use: Never     Allergies   Patient has no known allergies.   Review of Systems Review of Systems Per HPI  Physical Exam Triage Vital Signs ED Triage Vitals  Encounter Vitals Group     BP 11/14/23 1915 127/88     Systolic BP Percentile --      Diastolic BP Percentile --      Pulse Rate 11/14/23 1915 93     Resp 11/14/23 1915 16  Temp 11/14/23 1915 98.8 F (37.1 C)     Temp Source 11/14/23 1915 Oral     SpO2 11/14/23 1915 94 %     Weight --      Height --      Head Circumference --      Peak Flow --      Pain Score 11/14/23 1911 3     Pain Loc --      Pain Education --      Exclude from Growth Chart --    No data found.  Updated Vital Signs BP 127/88 (BP Location: Left Arm)   Pulse 93   Temp 98.8 F (37.1 C) (Oral)   Resp 16   SpO2 94%   Visual Acuity Right Eye Distance:   Left Eye Distance:   Bilateral Distance:    Right Eye Near:   Left Eye Near:    Bilateral Near:     Physical Exam Vitals and nursing note reviewed.  Constitutional:      Appearance: Normal appearance.  HENT:     Head: Atraumatic.  Eyes:     Extraocular Movements: Extraocular movements intact.      Conjunctiva/sclera: Conjunctivae normal.  Cardiovascular:     Rate and Rhythm: Normal rate.  Pulmonary:     Effort: Pulmonary effort is normal.  Musculoskeletal:        General: Tenderness present. Normal range of motion.     Cervical back: Normal range of motion and neck supple.  Skin:    General: Skin is warm and dry.     Findings: Erythema present.     Comments: Scabbed lesion consistent with a bug bite to the left lower posterior leg with surrounding erythema, edema, warmth.  No fluctuance, induration, drainage or bleeding  Neurological:     Mental Status: He is oriented to person, place, and time.     Comments: Left lower extremity neurovascularly intact  Psychiatric:        Mood and Affect: Mood normal.        Thought Content: Thought content normal.        Judgment: Judgment normal.      UC Treatments / Results  Labs (all labs ordered are listed, but only abnormal results are displayed) Labs Reviewed - No data to display  EKG   Radiology No results found.  Procedures Procedures (including critical care time)  Medications Ordered in UC Medications - No data to display  Initial Impression / Assessment and Plan / UC Course  I have reviewed the triage vital signs and the nursing notes.  Pertinent labs & imaging results that were available during my care of the patient were reviewed by me and considered in my medical decision making (see chart for details).     Does appear to be consistent with a developing cellulitis but may also be more of an allergic site reaction so we will treat with Keflex, triamcinolone cream, ice, antihistamines.  Return for worsening symptoms.  Final Clinical Impressions(s) / UC Diagnoses   Final diagnoses:  Cellulitis of left lower extremity   Discharge Instructions   None    ED Prescriptions     Medication Sig Dispense Auth. Provider   cephALEXin (KEFLEX) 500 MG capsule  (Status: Discontinued) Take 1 capsule (500 mg total) by  mouth 2 (two) times daily. 14 capsule Corbin Dess, PA-C   triamcinolone cream (KENALOG) 0.1 %  (Status: Discontinued) Apply 1 Application topically 2 (two) times daily. 60 g Tacy Expose  Elizabeth, PA-C   cephALEXin (KEFLEX) 500 MG capsule Take 1 capsule (500 mg total) by mouth 2 (two) times daily. 14 capsule Corbin Dess, PA-C   triamcinolone cream (KENALOG) 0.1 % Apply 1 Application topically 2 (two) times daily. 60 g Corbin Dess, [place]      PDMP not reviewed this encounter.   Corbin Dess, [place] 11/15/23 [phone]

## 2023-11-22 ENCOUNTER — Encounter (INDEPENDENT_AMBULATORY_CARE_PROVIDER_SITE_OTHER): Payer: Self-pay

## 2024-02-25 NOTE — Progress Notes (Signed)
 ALS clinic follow-up visit PCP:  [facility], [place], [place] Reason for evaluation:  SOD1 ALS and face to face assessment of DME needs   Interval history:  The patient is a 43 year old man with a slow variant of SOD1 ALS.  In 2017 he began having difficulty walking up and down stairs due to left leg weakness.  He has been treated with intrathecal toferson since August 2019 (clinical trial participant) and takes riluzole.  He was last evaluated in February 2025.  He reports no change.  He remains on toferson monthly.  He has suffered no falls.  He has had a generally benign disease course.  He is independent of ADLs and works as a Scientist, water quality in [employer], [employer].  He rides a scooter at his job.  Sometimes he will use a cane.  He falls on occasion.  He does not wear AFOs.  He drives.  He lives alone near [place].  His family is in [place] .  He is adopted.  He has no children.   Speech is not slurred.  He does not exhibit pseudobulbar affect.  There is no dysphagia.  There is no neck weakness.  He has no head drop.  He does not require NIV or CoughAssist.  Left leg is weaker than right.  He denies arm weakness.  He holds handrails to navigate stairs.  He lives in a single-story residence equipped with a ramp.  He has a walk-in shower, toilet handles, and adjustable bed.   He has lost 60 lbs on Zepbound.  Past Medical History:   SOD1 ALS, obese, hypertension, dyslipidemia, history of melanoma, tonsillectomy, cholecystectomy.   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    [email or web address]   This study identified a novel SOD1 mutation in an ALS family with slow disease progression and prolonged survival highlighting the heterogeneity of phenotype-genotype correlation of f-ALS by SOD1 mutations.   Medications:  See MAR.  Family history:  Adopted.  Examination: Blood  pressure 110/74, pulse 88, height 1.753 m (5' 9), weight 127 kg (279 lb), SpO2 97%.   FVC 4.26 L, (85% predicted), NIF -120.  He has a cane.  Speech is not slurred.  He does not exhibit PBA.  There are no frontal release signs.  Jaw jerk is absent.  Face, palate, and tongue are normal.  Neck flexion and neck extension are normal.    Rhythmic tapping of fingers is not slowed.  Strength in the arms is normal.  Right leg strength is normal except for ankle dorsiflexion 4.  In the left leg:  hip flexion 4+, hip abduction 5, hip adduction 5, quadricep 4-4 -, hamstring 4+, and ankle dorsiflexion 4-4 -.  Upper extremity reflexes 1.  Hoffmann sign is absent.  Patellar reflexes are 3 with spread and there is crossed adduction.  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  He is stable on intrathecal tofersen.  Symptoms are limited to the legs.  He remains independent of ADLs, drives, and works.  He uses a cane sometimes.  He does not require PEG, NIV, or CoughAssist.  He will continue on tofersen.    He will follow-up in the ALS clinic in 6 months.   30 minutes spent on the day of the visit reviewing the medical record, labs, imaging, examining the patient and reviewing the plan with the patient as well as documenting in the medical record.

## 2024-03-19 ENCOUNTER — Other Ambulatory Visit: Payer: Self-pay

## 2024-03-19 ENCOUNTER — Emergency Department (HOSPITAL_BASED_OUTPATIENT_CLINIC_OR_DEPARTMENT_OTHER): Admitting: Radiology

## 2024-03-19 ENCOUNTER — Emergency Department (HOSPITAL_BASED_OUTPATIENT_CLINIC_OR_DEPARTMENT_OTHER)

## 2024-03-19 ENCOUNTER — Emergency Department (HOSPITAL_BASED_OUTPATIENT_CLINIC_OR_DEPARTMENT_OTHER)
Admission: EM | Admit: 2024-03-19 | Discharge: 2024-03-20 | Disposition: A | Discharge status: Home / Self Care | Attending: Emergency Medicine | Admitting: Emergency Medicine

## 2024-03-19 DIAGNOSIS — Z79899 Other long term (current) drug therapy: Secondary | ICD-10-CM | POA: Diagnosis not present

## 2024-03-19 DIAGNOSIS — R911 Solitary pulmonary nodule: Secondary | ICD-10-CM | POA: Diagnosis not present

## 2024-03-19 DIAGNOSIS — R1011 Right upper quadrant pain: Secondary | ICD-10-CM | POA: Diagnosis not present

## 2024-03-19 DIAGNOSIS — R0789 Other chest pain: Secondary | ICD-10-CM | POA: Diagnosis present

## 2024-03-19 DIAGNOSIS — R079 Chest pain, unspecified: Secondary | ICD-10-CM

## 2024-03-19 DIAGNOSIS — I10 Essential (primary) hypertension: Secondary | ICD-10-CM | POA: Insufficient documentation

## 2024-03-19 LAB — CBC
HCT: 43.7 % (ref 39.0–52.0)
Hemoglobin: 15.4 g/dL (ref 13.0–17.0)
MCH: 30.6 pg (ref 26.0–34.0)
MCHC: 35.2 g/dL (ref 30.0–36.0)
MCV: 86.9 fL (ref 80.0–100.0)
Platelets: 240 K/uL (ref 150–400)
RBC: 5.03 MIL/uL (ref 4.22–5.81)
RDW: 13.3 % (ref 11.5–15.5)
WBC: 7.9 K/uL (ref 4.0–10.5)
nRBC: 0 % (ref 0.0–0.2)

## 2024-03-19 LAB — HEPATIC FUNCTION PANEL
ALT: 38 U/L (ref 0–44)
AST: 25 U/L (ref 15–41)
Albumin: 4.1 g/dL (ref 3.5–5.0)
Alkaline Phosphatase: 71 U/L (ref 38–126)
Bilirubin, Direct: 0.3 mg/dL — ABNORMAL HIGH (ref 0.0–0.2)
Indirect Bilirubin: 0.3 mg/dL (ref 0.3–0.9)
Total Bilirubin: 0.6 mg/dL (ref 0.0–1.2)
Total Protein: 6.6 g/dL (ref 6.5–8.1)

## 2024-03-19 LAB — LIPASE, BLOOD: Lipase: 23 U/L (ref 11–51)

## 2024-03-19 LAB — BASIC METABOLIC PANEL WITH GFR
Anion gap: 16 — ABNORMAL HIGH (ref 5–15)
BUN: 10 mg/dL (ref 6–20)
CO2: 19 mmol/L — ABNORMAL LOW (ref 22–32)
Calcium: 10.2 mg/dL (ref 8.9–10.3)
Chloride: 106 mmol/L (ref 98–111)
Creatinine, Ser: 0.8 mg/dL (ref 0.61–1.24)
GFR, Estimated: >60 mL/min (ref >60)
Glucose, Bld: 112 mg/dL — ABNORMAL HIGH (ref 70–99)
Potassium: 3.9 mmol/L (ref 3.5–5.1)
Sodium: 140 mmol/L (ref 135–145)

## 2024-03-19 LAB — TROPONIN T, HIGH SENSITIVITY
Troponin T High Sensitivity: 17 ng/L (ref 0–19)
Troponin T High Sensitivity: 20 ng/L — ABNORMAL HIGH (ref 0–19)

## 2024-03-19 MED ORDER — LACTATED RINGERS IV BOLUS
1000.0000 mL | Freq: Once | INTRAVENOUS | Status: AC
Start: 2024-03-19 — End: 2024-03-19
  Administered 2024-03-19: 1000 mL via INTRAVENOUS

## 2024-03-19 MED ORDER — KETOROLAC TROMETHAMINE 15 MG/ML IJ SOLN
15.0000 mg | Freq: Once | INTRAMUSCULAR | Status: AC
Start: 2024-03-19 — End: 2024-03-19
  Administered 2024-03-19: 15 mg via INTRAVENOUS
  Filled 2024-03-19: qty 1

## 2024-03-19 MED ORDER — IOHEXOL 350 MG/ML SOLN
100.0000 mL | Freq: Once | INTRAVENOUS | Status: AC | PRN
  Administered 2024-03-19: 100 mL via INTRAVENOUS

## 2024-03-19 MED ORDER — METOPROLOL TARTRATE 25 MG PO TABS
25.0000 mg | ORAL_TABLET | Freq: Once | ORAL | Status: AC
  Administered 2024-03-19: 25 mg via ORAL
  Filled 2024-03-19: qty 1

## 2024-03-19 MED ORDER — LACTATED RINGERS IV BOLUS
1000.0000 mL | Freq: Once | INTRAVENOUS | Status: AC
  Administered 2024-03-19: 1000 mL via INTRAVENOUS

## 2024-03-19 NOTE — Discharge Instructions (Addendum)
 We are referring you to cardiology in regards to your high heart rate.  Make sure you take your medicines as prescribed.  You should also follow-up with your primary care provider.  Take ibuprofen and/or Tylenol to help with your symptoms.  There is also a lung nodule on your CT scan that has been there before and you will need a repeat CT scan in 12-18 months that your primary care provider can set up.  However, if your chest/abdominal pain worsens, you develop trouble breathing, or any other new/concerning symptoms then return to the ER.

## 2024-03-19 NOTE — ED Triage Notes (Signed)
 Pt POV reporting R ribcage pain and SOB that began today and worsened tonight, denies strenuous activity.

## 2024-03-19 NOTE — ED Provider Notes (Signed)
 [place] EMERGENCY DEPARTMENT AT [facility] Provider Note   CSN: [id number] Arrival date & time: 03/19/24  2029     Patient presents with: No chief complaint on file.   Nathan Pierce is a 43 y.o. male.   HPI 43 year old male presents with right-sided chest pain.  He has a history of ALS and hypertension.  Around 8 AM at work he noticed a little bit of dull discomfort to his right abdomen/lower chest.  However around 5 PM and then later around 7 PM the pain has acutely worsened.  It is now sharp and seems to be coming and going.  Certain movements but also breathing can cause an acute worsening.  He does not feel short of breath except when the pain flares.  No cough, fever, vomiting, back pain.  No urinary symptoms.  No history of leg swelling, recent surgery, or previous DVT. He has had a previous cholecystectomy.  Patient's heart rate is elevated and he notes that he has chronic tachycardia and takes metoprolol  for this.  Prior to Admission medications   Medication Sig Start Date End Date Taking? Authorizing Provider  amoxicillin -clavulanate (AUGMENTIN ) 875-125 MG tablet Take 1 tablet by mouth every 12 (twelve) hours. 04/28/23   Leath-Warren, Etta PARAS, NP  cephALEXin  (KEFLEX ) 500 MG capsule Take 1 capsule (500 mg total) by mouth 2 (two) times daily. 11/14/23   Stuart Vernell Norris, PA-C  cetirizine  (ZYRTEC ) 10 MG tablet Take 1 tablet (10 mg total) by mouth daily. 04/25/23   Leath-Warren, Etta PARAS, NP  diclofenac  Sodium (VOLTAREN ) 1 % GEL Apply 2 g topically 4 (four) times daily as needed (pain). 02/19/20   Long, Joshua G, MD  fluticasone  (FLONASE ) 50 MCG/ACT nasal spray Place 2 sprays into both nostrils daily. 04/25/23   Leath-Warren, Etta PARAS, NP  metoprolol  tartrate (LOPRESSOR ) 25 MG tablet Take 25 mg by mouth 2 (two) times daily.    [provider]  omeprazole (PRILOSEC) 20 MG capsule Take 40 mg by mouth. 02/06/18   [provider]  phenazopyridine   (PYRIDIUM ) 100 MG tablet Take 1 tablet (100 mg total) by mouth 3 (three) times daily as needed for pain. 08/05/19   Avegno, Komlanvi S, FNP  riluzole (RILUTEK) 50 MG tablet Take 50 mg by mouth every 12 (twelve) hours.    [provider]  rosuvastatin (CRESTOR) 10 MG tablet Take 10 mg by mouth daily.    [provider]  Tofersen (QALSODY) 100 MG/15ML SOLN     [provider]  triamcinolone  cream (KENALOG ) 0.1 % Apply 1 Application topically 2 (two) times daily. 11/14/23   Stuart Vernell Norris, PA-C  ZEPBOUND 5 MG/0.5ML Pen Inject 5 mg into the skin. 11/07/23   [provider]    Allergies: Patient has no known allergies.    Review of Systems  Constitutional:  Negative for fever.  Respiratory:  Negative for cough and shortness of breath.   Cardiovascular:  Positive for chest pain. Negative for leg swelling.  Gastrointestinal:  Positive for abdominal pain. Negative for vomiting.  Musculoskeletal:  Negative for back pain.    Updated Vital Signs BP (!) 144/103   Pulse 92   Temp 98.2 F (36.8 C) (Oral)   Resp 14   Ht 5' 9 (1.753 m)   Wt 120.7 kg   SpO2 99%   BMI 39.28 kg/m   Physical Exam Vitals and nursing note reviewed.  Constitutional:      Appearance: He is well-developed.  HENT:  Head: Normocephalic and atraumatic.  Cardiovascular:     Rate and Rhythm: Regular rhythm. Tachycardia present.     Heart sounds: Normal heart sounds.  Pulmonary:     Effort: Pulmonary effort is normal.     Breath sounds: Normal breath sounds.  Abdominal:     Palpations: Abdomen is soft.     Tenderness: There is abdominal tenderness in the right upper quadrant.   Musculoskeletal:     Right lower leg: No edema.     Left lower leg: No edema.  Skin:    General: Skin is warm and dry.  Neurological:     Mental Status: He is alert.     (all labs ordered are listed, but only abnormal results are displayed) Labs Reviewed  BASIC METABOLIC PANEL WITH GFR -  Abnormal; Notable for the following components:      Result Value   CO2 19 (*)    Glucose, Bld 112 (*)    Anion gap 16 (*)    All other components within normal limits  HEPATIC FUNCTION PANEL - Abnormal; Notable for the following components:   Bilirubin, Direct 0.3 (*)    All other components within normal limits  TROPONIN T, HIGH SENSITIVITY - Abnormal; Notable for the following components:   Troponin T High Sensitivity 20 (*)    All other components within normal limits  CBC  LIPASE, BLOOD  URINALYSIS, ROUTINE W REFLEX MICROSCOPIC  TROPONIN T, HIGH SENSITIVITY    EKG: None  Radiology: CT Angio Chest PE W and/or Wo Contrast Result Date: 03/19/2024 CLINICAL DATA:  Pulmonary embolism (PE) suspected, high prob Shortness of breath and right rib pain. EXAM: CT ANGIOGRAPHY CHEST WITH CONTRAST TECHNIQUE: Multidetector CT imaging of the chest was performed using the standard protocol during bolus administration of intravenous contrast. Multiplanar CT image reconstructions and MIPs were obtained to evaluate the vascular anatomy. RADIATION DOSE REDUCTION: This exam was performed according to the departmental dose-optimization program which includes automated exposure control, adjustment of the mA and/or kV according to patient size and/or use of iterative reconstruction technique. CONTRAST:  OMNIPAQUE  IOHEXOL  350 MG/ML SOLN COMPARISON:  Chest radiograph earlier today FINDINGS: Cardiovascular: There are no filling defects within the pulmonary arteries to suggest pulmonary embolus. The heart is normal in size. No pericardial effusion. The thoracic aorta is normal in caliber without acute findings. Mediastinum/Nodes: 11 mm right hilar node, nonspecific. No enlarged mediastinal lymph nodes. Decompressed esophagus. Lungs/Pleura: No focal airspace disease. No pleural effusion. No features of pulmonary edema. 6 mm lingular nodule, series 4, image 72, possibly partially calcified. This is unchanged from  February 2025 abdominal CT. The trachea and central airways are clear. Upper Abdomen: Assistant concurrent abdominopelvic CT, reported separately. Musculoskeletal: There are no acute or suspicious osseous abnormalities. No rib abnormalities to account for pain. Suspected right lateral chest wall lipoma, partially included in the field of view, series 6, image 1, incidental. Review of the MIP images confirms the above findings. IMPRESSION: 1. No pulmonary embolus or acute intrathoracic abnormality. 2. A 6 mm lingular nodule, possibly partially calcified. Fever calcified granuloma. This is unchanged from February 2025 abdominal CT documenting 6 months of imaging stability. CT at 12-18 months (from today's scan) is considered optional for low-risk patients, but is recommended for high-risk patients. This recommendation follows the consensus statement: Guidelines for Management of Incidental Pulmonary Nodules Detected on CT Images: From the Fleischner Society 2017; Radiology 2017; 284:228-243. Electronically Signed   By: Andrea Gasman M.D.   On:  03/19/2024 22:13   CT ABDOMEN PELVIS W CONTRAST Result Date: 03/19/2024 CLINICAL DATA:  Right upper quadrant pain. EXAM: CT ABDOMEN AND PELVIS WITH CONTRAST TECHNIQUE: Multidetector CT imaging of the abdomen and pelvis was performed using the standard protocol following bolus administration of intravenous contrast. Performed in conjunction with CTA of the chest, reported separately RADIATION DOSE REDUCTION: This exam was performed according to the departmental dose-optimization program which includes automated exposure control, adjustment of the mA and/or kV according to patient size and/or use of iterative reconstruction technique. CONTRAST:  OMNIPAQUE  IOHEXOL  350 MG/ML SOLN COMPARISON:  08/08/2023 FINDINGS: Lower chest: Assessed fully on concurrent chest CT. Hepatobiliary: Diffuse hepatic steatosis. No focal liver lesion. Cholecystectomy without biliary dilatation.  Pancreas: No ductal dilatation or inflammation. Mild motion artifact through the pancreatic tail. Spleen: Upper normal in size, 13.6 cm AP.  No focal abnormality. Adrenals/Urinary Tract: No adrenal nodule. No hydronephrosis or renal calculi. Simple cyst in the upper right kidney. No further follow-up imaging is recommended. No suspicious renal lesion. No evidence of renal inflammation. The urinary bladder is minimally distended, normal for degree of distension. Stomach/Bowel: Nondistended stomach. Allowing for motion artifact in the upper abdomen, no bowel inflammation. No bowel obstruction. Normal appendix visualized. Small volume of formed colonic stool. Vascular/Lymphatic: No acute vascular findings. Normal caliber abdominal aorta. The portal vein is patent. Reactive appearing portal caval node at 10 mm, series 4, image 31. No suspicious lymphadenopathy. Reproductive: Prostate is unremarkable. Other: No free air or ascites.  Minimal fat in the inguinal canals. Musculoskeletal: Mild L5-S1 degenerative disc disease. There are no acute or suspicious osseous abnormalities. IMPRESSION: 1. No acute abnormality in the abdomen/pelvis. 2. Hepatic steatosis. 3. Cholecystectomy without biliary dilatation. Electronically Signed   By: Andrea Gasman M.D.   On: 03/19/2024 21:58   DG Chest 2 View Result Date: 03/19/2024 CLINICAL DATA:  Chest pain with shortness of breath. EXAM: CHEST - 2 VIEW COMPARISON:  PA Lat chest 02/19/2020. FINDINGS: The heart size and mediastinal contours are within normal limits. There is a low inspiration on exam with increased linear atelectasis in the lateral left base. The lungs are clear of infiltrates. The visualized skeletal structures are unremarkable. IMPRESSION: Low inspiration with increased linear atelectasis in the lateral left base. No evidence of acute chest process. Electronically Signed   By: Francis Quam M.D.   On: 03/19/2024 21:03     Procedures   Medications Ordered in  the ED  lactated ringers  bolus 1,000 mL (0 mLs Intravenous Stopped 03/19/24 2146)  iohexol  (OMNIPAQUE ) 350 MG/ML injection 100 mL (100 mLs Intravenous Contrast Given 03/19/24 2137)  ketorolac  (TORADOL ) 15 MG/ML injection 15 mg (15 mg Intravenous Given 03/19/24 2256)  lactated ringers  bolus 1,000 mL (1,000 mLs Intravenous New Bag/Given 03/19/24 2256)  metoprolol  tartrate (LOPRESSOR ) tablet 25 mg (25 mg Oral Given 03/19/24 2257)                                    Medical Decision Making Amount and/or Complexity of Data Reviewed Labs: ordered.    Details: Troponin went from 17-20 Radiology: ordered and independent interpretation performed.    Details: No PE ECG/medicine tests: ordered and independent interpretation performed.    Details: Sinus tachycardia  Risk Prescription drug management.   Patient presents with right-sided chest pain.  Probably chest wall in etiology.  Worse with certain positions.  He feels better after some Toradol , was also  given some fluids and his nightly metoprolol .  Heart rate has appropriately gotten a lot better.  At times it was in the 130s.  Due to this, CTA was obtained as well as CT of the abdomen and pelvis given the location of his symptoms but there is no acute finding.  I did let him know about the lung nodule that will need follow-up.  His second troponin is technically abnormal though within 3 points of the initial.  I suspect this is probably from a heart rate issue as he is not having anginal symptoms.  He has no symptoms of infection.  No evidence of PE.  I do not think this is clinically significant and did talk to him about this and I think he can follow-up outpatient with cardiology.  Patient is comfortable with this plan.  He otherwise is well-appearing and ready for discharge.  Heart rate is now down into the 90s.  Will discharge home with return precautions.     Final diagnoses:  Right-sided chest pain  Lung nodule    ED Discharge Orders           Ordered    Ambulatory referral to Cardiology       Comments: If you have not heard from the Cardiology office within the next 72 hours please call [phone].   03/19/24 7651               Freddi Hamilton, MD 03/19/24 2354
